# Patient Record
Sex: Female | Born: 1945 | Race: White | Hispanic: No | Smoking: Current every day smoker
Health system: Southern US, Community
[De-identification: ages and names within clinical notes are randomized; demographics above are authoritative.]

## PROBLEM LIST (undated history)

## (undated) HISTORY — PX: CERVICAL FUSION: SHX112

## (undated) HISTORY — PX: ORIF ANKLE FRACTURE: SUR919

## (undated) HISTORY — PX: APPENDECTOMY: SHX54

## (undated) HISTORY — PX: SHOULDER ACROMIOPLASTY: SHX6093

## (undated) HISTORY — PX: ABDOMINAL HYSTERECTOMY: SHX81

---

## 2002-12-12 ENCOUNTER — Ambulatory Visit (HOSPITAL_BASED_OUTPATIENT_CLINIC_OR_DEPARTMENT_OTHER): Admission: RE | Admit: 2002-12-12 | Discharge: 2002-12-13 | Payer: Self-pay | Admitting: Orthopedic Surgery

## 2004-08-27 ENCOUNTER — Ambulatory Visit: Payer: Self-pay

## 2004-08-28 ENCOUNTER — Ambulatory Visit: Payer: Self-pay | Admitting: Family Medicine

## 2005-09-23 ENCOUNTER — Ambulatory Visit: Payer: Self-pay

## 2007-01-25 ENCOUNTER — Ambulatory Visit: Payer: Self-pay

## 2007-04-18 ENCOUNTER — Emergency Department: Payer: Self-pay | Admitting: Emergency Medicine

## 2007-04-18 ENCOUNTER — Other Ambulatory Visit: Payer: Self-pay

## 2007-06-09 ENCOUNTER — Ambulatory Visit: Payer: Self-pay | Admitting: Unknown Physician Specialty

## 2008-01-26 ENCOUNTER — Ambulatory Visit: Payer: Self-pay | Admitting: Family Medicine

## 2008-04-11 ENCOUNTER — Encounter: Payer: Self-pay | Admitting: Family Medicine

## 2009-08-02 ENCOUNTER — Ambulatory Visit: Payer: Self-pay | Admitting: Family

## 2009-11-28 ENCOUNTER — Ambulatory Visit: Payer: Self-pay | Admitting: Gastroenterology

## 2010-01-28 ENCOUNTER — Emergency Department: Payer: Self-pay | Admitting: Emergency Medicine

## 2010-08-04 ENCOUNTER — Ambulatory Visit: Payer: Self-pay | Admitting: Family

## 2011-08-27 ENCOUNTER — Ambulatory Visit: Payer: Self-pay | Admitting: Orthopedic Surgery

## 2011-09-22 ENCOUNTER — Ambulatory Visit: Payer: Self-pay | Admitting: Family Medicine

## 2011-09-24 ENCOUNTER — Ambulatory Visit: Payer: Self-pay | Admitting: Orthopedic Surgery

## 2011-09-24 DIAGNOSIS — Z01810 Encounter for preprocedural cardiovascular examination: Secondary | ICD-10-CM

## 2011-10-01 ENCOUNTER — Ambulatory Visit: Payer: Self-pay | Admitting: Orthopedic Surgery

## 2012-08-26 ENCOUNTER — Ambulatory Visit: Payer: Self-pay | Admitting: Ophthalmology

## 2012-09-07 ENCOUNTER — Ambulatory Visit: Payer: Self-pay | Admitting: Ophthalmology

## 2012-10-25 ENCOUNTER — Ambulatory Visit: Payer: Self-pay | Admitting: Ophthalmology

## 2013-01-16 ENCOUNTER — Ambulatory Visit: Payer: Self-pay | Admitting: Family Medicine

## 2013-01-19 ENCOUNTER — Ambulatory Visit: Payer: Self-pay | Admitting: Family Medicine

## 2014-02-01 ENCOUNTER — Ambulatory Visit: Payer: Self-pay | Admitting: Family Medicine

## 2014-09-21 NOTE — Op Note (Signed)
PATIENT NAME:  Patricia GrinderSHELLHOUSE, [name] MR#:  [id number] DATE OF BIRTH:  1946-02-01  DATE OF PROCEDURE:  10/25/2012  PREOPERATIVE DIAGNOSIS: Visually significant cataract of the left eye.   POSTOPERATIVE DIAGNOSIS: Visually significant cataract of the left eye.   OPERATIVE PROCEDURE: Cataract extraction by phacoemulsification with implant of intraocular lens to left eye.   SURGEON: Galen ManilaWilliam Raeford Brandenburg, MD.   ANESTHESIA:  1. Managed anesthesia care.  2. Topical tetracaine drops followed by 2% Xylocaine jelly applied in the preoperative holding area.   COMPLICATIONS: None.   TECHNIQUE:  Stop and chop.   DESCRIPTION OF PROCEDURE: The patient was examined and consented in the preoperative holding area where the aforementioned topical anesthesia was applied to the left eye and then brought back to the Operating Room where the left eye was prepped and draped in the usual sterile ophthalmic fashion and a lid speculum was placed. A paracentesis was created with the side port blade and the anterior chamber was filled with viscoelastic. A near clear corneal incision was performed with the steel keratome. A continuous curvilinear capsulorrhexis was performed with a cystotome followed by the capsulorrhexis forceps. Hydrodissection and hydrodelineation were carried out with BSS on a blunt cannula. The lens was removed in a stop and chop technique and the remaining cortical material was removed with the irrigation-aspiration handpiece. The capsular bag was inflated with viscoelastic and the Tecnis ZCB00 21.5-diopter lens, serial number [id number] was placed in the capsular bag without complication. The remaining viscoelastic was removed from the eye with the irrigation-aspiration handpiece. The wounds were hydrated. The anterior chamber was flushed with Miostat and the eye was inflated to physiologic pressure. 0.1 mL of cefuroxime concentration 10 mg/mL was placed in the anterior chamber. The wounds were found to be  water tight. The eye was dressed with Vigamox and Combigan. The patient was given protective glasses to wear throughout the day and a shield with which to sleep tonight. The patient was also given drops with which to begin a drop regimen today and will follow-up with me in one day.  ____________________________ Jerilee FieldWilliam L. Kalei Mckillop, MD wlp:sb D: 10/25/2012 12:34:54 ET T: 10/25/2012 13:01:06 ET JOB#: [id number]  cc: Makennah Omura L. Rehaan Viloria, MD, <Dictator> Jerilee FieldWILLIAM L Yanet Balliet MD ELECTRONICALLY SIGNED 10/27/2012 17:56

## 2014-09-21 NOTE — Op Note (Signed)
PATIENT NAME:  Darrick GrinderSHELLHOUSE, Patricia Hahn DATE OF BIRTH:  01-24-1946  DATE OF PROCEDURE:  09/07/2012  PREOPERATIVE DIAGNOSIS: Visually significant cataract of the right eye.   POSTOPERATIVE DIAGNOSIS: Visually significant cataract of the right eye.   OPERATIVE PROCEDURE: Cataract extraction by phacoemulsification with implant of intraocular lens to right eye.   SURGEON: Galen ManilaWilliam Brelynn Wheller, MD.   ANESTHESIA:  1. Managed anesthesia care.  2. Topical tetracaine drops followed by 2% Xylocaine jelly applied in the preoperative holding area.   COMPLICATIONS: None.   TECHNIQUE:  Stop and chop.  DESCRIPTION OF PROCEDURE: The patient was examined and consented in the preoperative holding area where the aforementioned topical anesthesia was applied to the right eye and then brought back to the Operating Room where the right eye was prepped and draped in the usual sterile ophthalmic fashion and a lid speculum was placed. A paracentesis was created with the side port blade and the anterior chamber was filled with viscoelastic. A near clear corneal incision was performed with the steel keratome. A continuous curvilinear capsulorrhexis was performed with a cystotome followed by the capsulorrhexis forceps. Hydrodissection and hydrodelineation were carried out with BSS on a blunt cannula. The lens was removed in a stop and chop technique and the remaining cortical material was removed with the irrigation-aspiration handpiece. The capsular bag was inflated with viscoelastic and the Tecnis ZCB 22.0-diopter lens, serial number [id number] was placed in the capsular bag without complication. The remaining viscoelastic was removed from the eye with the irrigation-aspiration handpiece. The wounds were hydrated. The anterior chamber was flushed with Miostat and the eye was inflated to physiologic pressure. 0.1 mL of cefuroxime concentration 10 mg/mL was placed in the anterior chamber. The wounds were found to be  water tight. The eye was dressed with Vigamox. The patient was given protective glasses to wear throughout the day and a shield with which to sleep tonight. The patient was also given drops with which to begin a drop regimen today and will follow-up with me in one day.    ____________________________ Patricia FieldWilliam L. Kenadie Royce, MD wlp:OSi D: 09/07/2012 11:12:16 ET T: 09/07/2012 11:34:12 ET JOB#: [id number]  cc: Paislie Tessler L. Pilar Westergaard, MD, <Dictator> Patricia FieldWILLIAM L Oryon Gary MD ELECTRONICALLY SIGNED 09/13/2012 12:51

## 2014-09-23 NOTE — Op Note (Signed)
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  PROCEDURES:  1. Distal clavicle excision.  2. Rotator cuff repair, right shoulder.   ANESTHESIA: General.   SURGEON: Leitha SchullerMichael J. Beauty Pless, MD    DESCRIPTION OF PROCEDURE: The patient was brought to the operating room and after adequate anesthesia was obtained, a bump was placed underneath the right shoulder blade with the patient in a semirecumbent position. After prepping and draping in the usual sterile fashion, an incision was made over the [facility] and exposure of the [facility] was carried out. Approximately a quarter-inch of the distal clavicle was removed. There was extensive arthritic changes. After removal of this, the wound was thoroughly irrigated. Bone wax was applied to the end of the bone and the joint closed with #1 Vicryl. A deltoid-on approach was then made after making a more lateral incision over the distal acromion and the subacromial space was entered. Decompression was carried out with the use of a motorized rasp and release of the CA ligament. There was about a 2 cm tear with a vertical split. This was mobilized and two suture anchors placed after first placing sutures with the suture device. Sutures were placed more anterior and posterior and crisscrossed with the anterior sutures going to the posterior anchor which was placed distal to the greater tuberosity. After tightening both of these sutures down, there appeared to be anatomic repair. The shoulder was placed through a range of motion and the repair appeared stable. The excess suture was removed, the wound was thoroughly irrigated, the deltoid repaired using #1 Vicryl, 2-0 Vicryl subcutaneously, and 4-0 nylon for the skin. 30 mL of 0.5% Sensorcaine with  epinephrine was infiltrated in the area of the incision for postop analgesia.   ____________________________ Leitha SchullerMichael J. Patsye Sullivant, MD mjm:drc D: 10/01/2011 20:57:48 ET T: 10/02/2011 08:44:29 ET JOB#: [id number] Nolon BussingMICHAEL J Seona Clemenson MD ELECTRONICALLY SIGNED 10/03/2011 19:04

## 2014-09-26 ENCOUNTER — Ambulatory Visit
Admit: 2014-09-26 | Disposition: A | Discharge status: Home / Self Care | Payer: Self-pay | Attending: Orthopedic Surgery | Admitting: Orthopedic Surgery

## 2014-10-19 ENCOUNTER — Ambulatory Visit: Payer: 59 | Admitting: Anesthesiology

## 2014-10-19 ENCOUNTER — Ambulatory Visit
Admission: RE | Admit: 2014-10-19 | Discharge: 2014-10-19 | Disposition: A | Discharge status: Home / Self Care | Payer: 59 | Source: Ambulatory Visit | Attending: Gastroenterology | Admitting: Gastroenterology

## 2014-10-19 ENCOUNTER — Encounter: Payer: Self-pay | Admitting: Gastroenterology

## 2014-10-19 ENCOUNTER — Encounter
Admission: RE | Disposition: A | Discharge status: Home / Self Care | Payer: Self-pay | Source: Ambulatory Visit | Attending: Gastroenterology

## 2014-10-19 DIAGNOSIS — K621 Rectal polyp: Secondary | ICD-10-CM | POA: Insufficient documentation

## 2014-10-19 DIAGNOSIS — Z79899 Other long term (current) drug therapy: Secondary | ICD-10-CM | POA: Insufficient documentation

## 2014-10-19 DIAGNOSIS — Z800 Family history of malignant neoplasm of digestive organs: Secondary | ICD-10-CM | POA: Insufficient documentation

## 2014-10-19 DIAGNOSIS — Z1211 Encounter for screening for malignant neoplasm of colon: Secondary | ICD-10-CM | POA: Diagnosis not present

## 2014-10-19 DIAGNOSIS — K5730 Diverticulosis of large intestine without perforation or abscess without bleeding: Secondary | ICD-10-CM | POA: Insufficient documentation

## 2014-10-19 HISTORY — PX: COLONOSCOPY: SHX5424

## 2014-10-19 SURGERY — COLONOSCOPY
Anesthesia: General

## 2014-10-19 MED ORDER — SODIUM CHLORIDE 0.9 % IV SOLN
INTRAVENOUS | Status: DC
  Administered 2014-10-19: 1000 mL via INTRAVENOUS

## 2014-10-19 MED ORDER — LIDOCAINE HCL (CARDIAC) 20 MG/ML IV SOLN
INTRAVENOUS | Status: DC | PRN
  Administered 2014-10-19: 50 mg via INTRAVENOUS

## 2014-10-19 MED ORDER — PROPOFOL INFUSION 10 MG/ML OPTIME
INTRAVENOUS | Status: DC | PRN
  Administered 2014-10-19: 120 ug/kg/min via INTRAVENOUS

## 2014-10-19 MED ORDER — PROPOFOL 10 MG/ML IV BOLUS
INTRAVENOUS | Status: DC | PRN
  Administered 2014-10-19: 30 mg via INTRAVENOUS
  Administered 2014-10-19: 70 mg via INTRAVENOUS

## 2014-10-19 NOTE — Anesthesia Postprocedure Evaluation (Signed)
  Anesthesia Post-op Note  Patient: Patricia PentonLee Ann Cassedy  Procedure(s) Performed: Procedure(s): COLONOSCOPY (N/A)  Anesthesia type:General  Patient location: PACU  Post pain: Pain level controlled  Post assessment: Post-op Vital signs reviewed, Patient's Cardiovascular Status Stable, Respiratory Function Stable, Patent Airway and No signs of Nausea or vomiting  Post vital signs: Reviewed and stable  Last Vitals:  Filed Vitals:   10/19/14 1042  BP: 105/56  Pulse: 73  Temp: 35.9 C  Resp: 14    Level of consciousness: awake, alert  and patient cooperative  Complications: No apparent anesthesia complications

## 2014-10-19 NOTE — Op Note (Signed)
[facility] Gastroenterology Patient Name: Patricia Hahn Procedure Date: 10/19/2014 10:06 AM MRN: [id number] Account #: [id number] Date of Birth: 1946/02/17 Admit Type: Outpatient Age: [age] [facility] Gender: Female Note Status: Finalized Procedure:         Colonoscopy Indications:       Screening in patient at increased risk: Family history of                     1st-degree relative with colorectal cancer Providers:         Ezzard StandingPaul Y. Bluford Kaufmannh, MD Referring MD:      [facility]Dimple Nanascharles [facility] (Referring MD) Medicines:         Monitored Anesthesia Care Complications:     No immediate complications. Procedure:         Pre-Anesthesia Assessment:                    - Prior to the procedure, a History and Physical was                     performed, and patient medications, allergies and                     sensitivities were reviewed. The patient's tolerance of                     previous anesthesia was reviewed.                    - The risks and benefits of the procedure and the sedation                     options and risks were discussed with the patient. All                     questions were answered and informed consent was obtained.                    - After reviewing the risks and benefits, the patient was                     deemed in satisfactory condition to undergo the procedure.                    After obtaining informed consent, the colonoscope was                     passed under direct vision. Throughout the procedure, the                     patient's blood pressure, pulse, and oxygen saturations                     were monitored continuously. The Colonoscope was                     introduced through the anus and advanced to the the cecum,                     identified by appendiceal orifice and ileocecal valve. The                     colonoscopy was performed without difficulty. The patient  tolerated the  procedure well. The quality of the bowel                     preparation was good. Findings:      Multiple small and large-mouthed diverticula were found in the sigmoid       colon.      Five sessile polyps were found in the rectum. The polyps were diminutive       in size. These polyps were removed with a cold snare. Resection and       retrieval were complete.      The exam was otherwise without abnormality. Impression:        - Diverticulosis in the sigmoid colon.                    - Five diminutive polyps in the rectum. Resected and                     retrieved.                    - The examination was otherwise normal. Recommendation:    - Discharge patient to home.                    - Await pathology results.                    - Repeat colonoscopy in 5 years for surveillance.                    - The findings and recommendations were discussed with the                     patient. Procedure Code(s): --- Professional ---                    [phone], Colonoscopy, flexible; with removal of tumor(s),                     polyp(s), or other lesion(s) by snare technique Diagnosis Code(s): --- Professional ---                    Z80.0, Family history of malignant neoplasm of digestive                     organs                    K62.1, Rectal polyp                    K57.30, Diverticulosis of large intestine without                     perforation or abscess without bleeding CPT copyright 2014 American Medical Association. All rights reserved. The codes documented in this report are preliminary and upon coder review may  be revised to meet current compliance requirements. Wallace CullensPaul Y Hazelene Doten, MD 10/19/2014 10:32:47 AM This report has been signed electronically. Number of Addenda: 0 Note Initiated On: 10/19/2014 10:06 AM Scope Withdrawal Time: 0 hours 6 minutes 44 seconds  Total Procedure Duration: 0 hours 10 minutes 2 seconds       [facility]

## 2014-10-19 NOTE — Transfer of Care (Signed)
Immediate Anesthesia Transfer of Care Note  Patient: Patricia Hahn  Procedure(s) Performed: Procedure(s): COLONOSCOPY (N/A)  Patient Location: PACU and Endoscopy Unit  Anesthesia Type:General  Level of Consciousness: awake and alert   Airway & Oxygen Therapy: Patient Spontanous Breathing and Patient connected to nasal cannula oxygen  Post-op Assessment: Report given to RN and Post -op Vital signs reviewed and stable  Post vital signs: Reviewed  Last Vitals:  Filed Vitals:   10/19/14 1042  BP: 105/56  Pulse: 73  Temp: 35.9 C  Resp: 14    Complications: No apparent anesthesia complications

## 2014-10-19 NOTE — H&P (Signed)
    Primary Care Physician:  Pcp Not In System Primary Gastroenterologist:  Dr. Bluford Kaufmannh  Pre-Procedure History & Physical: HPI:  Patricia MoodLee Ann Bohanon is a 69 y.o. female is here for colonoscopy.   History reviewed. No pertinent past medical history.  History reviewed. No pertinent past surgical history.  Prior to Admission medications   Medication Sig Start Date End Date Taking? Authorizing Provider  ibuprofen (ADVIL,MOTRIN) 200 MG tablet Take 400 mg by mouth every 6 (six) hours as needed for headache or moderate pain.   Yes Historical Provider, MD  pravastatin (PRAVACHOL) 20 MG tablet Take 20 mg by mouth daily.   Yes Historical Provider, MD  VITAMIN D, ERGOCALCIFEROL, PO Take 1 capsule by mouth 2 (two) times daily.   Yes Historical Provider, MD    Allergies as of 10/03/2014  . (Not on File)    History reviewed. No pertinent family history.  History   Social History  . Marital Status: Divorced    Spouse Name: N/A  . Number of Children: N/A  . Years of Education: N/A   Occupational History  . Not on file.   Social History Main Topics  . Smoking status: Not on file  . Smokeless tobacco: Not on file  . Alcohol Use: Not on file  . Drug Use: Not on file  . Sexual Activity: Not on file   Other Topics Concern  . Not on file   Social History Narrative  . No narrative on file    Review of Systems: See HPI, otherwise negative ROS  Physical Exam: There were no vitals taken for this visit. General:   Alert,  pleasant and cooperative in NAD Head:  Normocephalic and atraumatic. Neck:  Supple; no masses or thyromegaly. Lungs:  Clear throughout to auscultation.    Heart:  Regular rate and rhythm. Abdomen:  Soft, nontender and nondistended. Normal bowel sounds, without guarding, and without rebound.   Neurologic:  Alert and  oriented x4;  grossly normal neurologically.  Impression/Plan: Patricia Hahn is here for colonoscopy to be performed for screening and family hx of  colon cancer.   Risks, benefits, limitations, and alternatives regarding colonoscopy have been reviewed with the patient.  Questions have been answered.  All parties agreeable.   Kamari Bilek, Ezzard StandingPAUL Y, MD  10/19/2014, 9:29 AM

## 2014-10-19 NOTE — Anesthesia Preprocedure Evaluation (Addendum)
Anesthesia Evaluation  Patient identified by MRN, date of birth, ID band Patient awake    Reviewed: Allergy & Precautions, NPO status , Patient's Chart, lab work & pertinent test results  Airway Mallampati: II  TM Distance: >3 FB Neck ROM: Full    Dental  (+) Upper Dentures   Pulmonary Current Smoker (1/2 ppd),          Cardiovascular     Neuro/Psych    GI/Hepatic   Endo/Other    Renal/GU      Musculoskeletal   Abdominal   Peds  Hematology   Anesthesia Other Findings   Reproductive/Obstetrics                            Anesthesia Physical Anesthesia Plan  ASA: II  Anesthesia Plan: General   Post-op Pain Management:    Induction: Intravenous  Airway Management Planned: Nasal Cannula  Additional Equipment:   Intra-op Plan:   Post-operative Plan:   Informed Consent: I have reviewed the patients History and Physical, chart, labs and discussed the procedure including the risks, benefits and alternatives for the proposed anesthesia with the patient or authorized representative who has indicated his/her understanding and acceptance.     Plan Discussed with:   Anesthesia Plan Comments:         Anesthesia Quick Evaluation

## 2014-10-23 ENCOUNTER — Encounter: Payer: Self-pay | Admitting: Gastroenterology

## 2014-10-24 LAB — SURGICAL PATHOLOGY

## 2015-07-23 ENCOUNTER — Other Ambulatory Visit: Payer: Self-pay | Admitting: Internal Medicine

## 2015-07-23 DIAGNOSIS — Z1231 Encounter for screening mammogram for malignant neoplasm of breast: Secondary | ICD-10-CM

## 2015-08-01 ENCOUNTER — Ambulatory Visit
Admission: RE | Admit: 2015-08-01 | Discharge: 2015-08-01 | Disposition: A | Discharge status: Home / Self Care | Payer: 59 | Source: Ambulatory Visit | Attending: Internal Medicine | Admitting: Internal Medicine

## 2015-08-01 DIAGNOSIS — Z1231 Encounter for screening mammogram for malignant neoplasm of breast: Secondary | ICD-10-CM | POA: Diagnosis present

## 2015-10-01 ENCOUNTER — Ambulatory Visit: Payer: 59 | Attending: Neurology | Admitting: Physical Therapy

## 2015-10-01 ENCOUNTER — Encounter: Payer: Self-pay | Admitting: Physical Therapy

## 2015-10-01 DIAGNOSIS — R2681 Unsteadiness on feet: Secondary | ICD-10-CM | POA: Diagnosis present

## 2015-10-01 DIAGNOSIS — R262 Difficulty in walking, not elsewhere classified: Secondary | ICD-10-CM | POA: Insufficient documentation

## 2015-10-01 NOTE — Therapy (Signed)
[facility] [address] [place], [place], [address] Phone: [phone]   Fax:  [phone]  Physical Therapy Treatment  Patient Details  Name: Patricia Hahn MRN: [id number] Date of Birth: 04/18/1946 Referring Provider: Morene CrockerPOTTER, ZACHARY E  Encounter Date: 10/01/2015      PT End of Session - 10/01/15 1619    Visit Number 1   Number of Visits 13   Date for PT Re-Evaluation 11/12/15   PT Start Time 0300   PT Stop Time 0350   PT Time Calculation (min) 50 min   Equipment Utilized During Treatment Gait belt   Activity Tolerance Patient limited by fatigue      No past medical history on file.  Past Surgical History  Procedure Laterality Date  . Shoulder acromioplasty    . Orif ankle fracture    . Cervical fusion    . Appendectomy    . Abdominal hysterectomy    . Colonoscopy N/A 10/19/2014    Procedure: COLONOSCOPY;  Surgeon: Wallace CullensPaul Y Oh, MD;  [facility];  Service: Gastroenterology;  Laterality: N/A;    There were no vitals filed for this visit.      Subjective Assessment - 10/01/15 1504    Subjective Patient is stumbling and unsteady gait.    Pertinent History Patient had a right ankle fracture 2 yrs ago, cancer, falls, lives alone   Currently in Pain? Yes   Pain Score 7    Pain Location Ankle            [facility] PT Assessment - 10/01/15 0001    Assessment   Medical Diagnosis gait imbalance   Referring Provider Theora MasterOTTER, ZACHARY E   Onset Date/Surgical Date 10/01/15   Hand Dominance Left   Next MD Visit 10/08/15   Prior Therapy no   Precautions   Precautions Fall   Restrictions   Weight Bearing Restrictions No   Balance Screen   Has the patient fallen in the past 6 months Yes   How many times? 6   Has the patient had a decrease in activity level because of a fear of falling?  Yes   Is the patient reluctant to leave their home because of a fear of falling?  No   Home Environment   Living  Environment Private residence   Living Arrangements Alone   Available Help at Discharge Family   Type of Home Mobile home   Home Access Stairs to enter   Entrance Stairs-Number of Steps 6   Entrance Stairs-Rails Cannot reach both   Home Layout One level   Home Equipment None      PAIN: RLE ankle pain 7/10   POSTURE: WFL  PROM/AROM: WFL  STRENGTH:  Graded on a 0-5 scale Muscle Group Left Right  Shoulder flex    Shoulder Abd    Shoulder Ext    Shoulder IR/ER    Elbow    Wrist/hand    Hip Flex 3+ 3+  Hip Abd 3+ 3+  Hip Add -3 -3  Hip Ext -3 -3  Hip IR/ER 3+ 3+  Knee Flex 4 4  Knee Ext 4 4  Ankle DF 5 5  Ankle PF 4 4   SENSATION: no numbness or tingling in BLE, numbness in thumb and first digit     BALANCE: static and dynamic standing balance deficits , unable to perform single leg stand or tandem stand   GAIT:  sway and deviation to path, uneven step  length  OUTCOME MEASURES: TEST Outcome Interpretation  5 times sit<>stand 23.62sec >91 yo, >15 sec indicates increased risk for falls  10 meter walk test   . 76              m/s <1.0 m/s indicates increased risk for falls; limited community ambulator  Timed up and Go 18. 61                sec <14 sec indicates increased risk for falls  6 minute walk test    900g            Feet 1000 feet is Tourist information centre manager    )                                     PT Long Term Goals - 22-Oct-2015 1635    PT LONG TERM GOAL #1   Title Patient will reduce timed up and go to <11 seconds to reduce fall risk and demonstrate improved transfer/gait ability   Time 6   Status New   PT LONG TERM GOAL #2   Title Patient will increase 10 meter walk test to >1.61m/s as to improve gait speed for better community ambulation and to reduce fall risk.   Time 6   Status New   PT LONG TERM GOAL #3   Title Patient will increase six minute walk test distance to >1000 for progression to community ambulator and improve gait  ability   Time 6   Period Weeks   Status New   PT LONG TERM GOAL #4   Title Patient will tolerate 5 seconds of single leg stance without loss of balance to improve ability to get in and out of shower safely   Time 6   Period Weeks   Status New               Plan - Oct 22, 2015 1621    Clinical Impression Statement Patient has decreased strength BLE and impaired gait with devicits and a falls risk. She has right ankle pain and decreased ambulation distances.    Rehab Potential Good   Clinical Impairments Affecting Rehab Potential weakness, falls, lives alone   PT Frequency 2x / week      Patient will benefit from skilled therapeutic intervention in order to improve the following deficits and impairments:  Abnormal gait, Pain, Decreased mobility, Decreased coordination, Decreased activity tolerance, Decreased balance, Decreased strength, Difficulty walking  Visit Diagnosis: Difficulty in walking, not elsewhere classified  Unsteadiness on feet       G-Codes - 10/22/2015 1626    Functional Assessment Tool Used TUG, 10 MW, 6 MW, 5 x sit to stand   Functional Limitation Mobility: Walking and moving around   Mobility: Walking and Moving Around Current Status [phone]) At least 40 percent but less than 60 percent impaired, limited or restricted   Mobility: Walking and Moving Around Goal Status [phone]) At least 20 percent but less than 40 percent impaired, limited or restricted      Problem List There are no active problems to display for this patient.  Patricia Hahn, PT, DPT [place], Barkley Bruns S 2015-10-22, 4:43 PM  [facility] MAIN [facility] SERVICES [address] [place], [place], [address] Phone: [phone]   Fax:  [phone]  Name: Patricia Hahn MRN: [id number] Date of Birth: December 22, 1945

## 2015-10-01 NOTE — Addendum Note (Signed)
Addended by: Ezekiel InaMANSFIELD, Brittin Janik S on: 10/01/2015 04:46 PM   Modules accepted: Orders

## 2015-10-03 ENCOUNTER — Ambulatory Visit: Payer: 59 | Admitting: Physical Therapy

## 2015-10-03 ENCOUNTER — Encounter: Payer: Self-pay | Admitting: Physical Therapy

## 2015-10-03 DIAGNOSIS — R262 Difficulty in walking, not elsewhere classified: Secondary | ICD-10-CM | POA: Diagnosis not present

## 2015-10-03 DIAGNOSIS — R2681 Unsteadiness on feet: Secondary | ICD-10-CM

## 2015-10-03 NOTE — Therapy (Signed)
[place] [facility] MAIN [facility] SERVICES [address] [place], [place], [address] Phone: [phone]   Fax:  [phone]  Physical Therapy Treatment  Patient Details  Name: Patricia Hahn MRN: [id number] Date of Birth: 07/07/1945 Referring Provider: Morene Crocker  Encounter Date: 10/03/2015      PT End of Session - 10/03/15 1525    Visit Number 2   Number of Visits 13   Date for PT Re-Evaluation 11/12/15   PT Start Time 0315   PT Stop Time 0400   PT Time Calculation (min) 45 min   Equipment Utilized During Treatment Gait belt   Activity Tolerance Patient limited by fatigue      History reviewed. No pertinent past medical history.  Past Surgical History  Procedure Laterality Date  . Shoulder acromioplasty    . Orif ankle fracture    . Cervical fusion    . Appendectomy    . Abdominal hysterectomy    . Colonoscopy N/A 10/19/2014    Procedure: COLONOSCOPY;  Surgeon: Wallace Cullens, MD;  Location: [facility] ENDOSCOPY;  Service: Gastroenterology;  Laterality: N/A;    There were no vitals filed for this visit.      Subjective Assessment - 10/03/15 1524    Subjective Patient is stumbling and unsteady gait.    Pertinent History Patient had a right ankle fracture 2 yrs ago, cancer, falls, lives alone   Currently in Pain? Yes   Pain Score 6    Pain Location Ankle   Pain Orientation Left        Leg press 60 lbs x 20 x 2, heel raises 60 lbs x 20 x 2 Supine SAQ 3 lbs x 5 sec hold x 20 x 2 hooklying abd/ER with RTB x 20 x 2 SLR BLE x 20  Bridges x 20 x 2 Hip abd x 20 beginning with 3 lbs and decreasing to 0 lbs   mod verbal cues used throughout with increased in postural sway and LOB most seen with narrow base of support .  Patient performs intermediate level exercises without pain behaviors and needs verbal cuing for postural alignment and head positioning                              PT Long Term Goals - 10/01/15  1635    PT LONG TERM GOAL #1   Title Patient will reduce timed up and go to <11 seconds to reduce fall risk and demonstrate improved transfer/gait ability   Time 6   Status New   PT LONG TERM GOAL #2   Title Patient will increase 10 meter walk test to >1.57m/s as to improve gait speed for better community ambulation and to reduce fall risk.   Time 6   Status New   PT LONG TERM GOAL #3   Title Patient will increase six minute walk test distance to >1000 for progression to community ambulator and improve gait ability   Time 6   Period Weeks   Status New   PT LONG TERM GOAL #4   Title Patient will tolerate 5 seconds of single leg stance without loss of balance to improve ability to get in and out of shower safely   Time 6   Period Weeks   Status New               Plan - 10/03/15 1526    Clinical Impression Statement Patient has  decreased strength BLE and abnormal gait wiht unsteadness. She is able to perform moderate difficulty exercises without increased pain behaviors.    Rehab Potential Good   Clinical Impairments Affecting Rehab Potential weakness, falls, lives alone   PT Frequency 2x / week      Patient will benefit from skilled therapeutic intervention in order to improve the following deficits and impairments:  Abnormal gait, Pain, Decreased mobility, Decreased coordination, Decreased activity tolerance, Decreased balance, Decreased strength, Difficulty walking  Visit Diagnosis: Difficulty in walking, not elsewhere classified  Unsteadiness on feet     Problem List There are no active problems to display for this patient.  Ezekiel InaKristine S Suleika Donavan, PT, DPT [place], Bark[place] BrunsKristine S 10/03/2015, 3:29 PM  [place] [facility] [address] [place], [place], [address] Phone: [phone]   Fax:  [phone]  Name: Malachy MoodLee Ann Sperbeck MRN: [id number] Date of Birth: [birth year]

## 2015-10-08 ENCOUNTER — Encounter: Payer: Self-pay | Admitting: Physical Therapy

## 2015-10-08 ENCOUNTER — Ambulatory Visit: Payer: 59 | Admitting: Physical Therapy

## 2015-10-08 DIAGNOSIS — R262 Difficulty in walking, not elsewhere classified: Secondary | ICD-10-CM | POA: Diagnosis not present

## 2015-10-08 DIAGNOSIS — R2681 Unsteadiness on feet: Secondary | ICD-10-CM

## 2015-10-08 NOTE — Therapy (Signed)
[place] [facility] MAIN [facility] SERVICES [address] [place], [place], [address] Phone: [phone]   Fax:  [phone]  Physical Therapy Treatment  Patient Details  Name: Patricia Hahn MRN: [id number] Date of Birth: November 12, 1945 Referring Provider: Morene Crocker  Encounter Date: 10/08/2015      PT End of Session - 10/08/15 1523    Visit Number 3   Number of Visits 13   Date for PT Re-Evaluation 11/12/15   PT Start Time 0315   PT Stop Time 0400   PT Time Calculation (min) 45 min   Equipment Utilized During Treatment Gait belt   Activity Tolerance Patient limited by fatigue      History reviewed. No pertinent past medical history.  Past Surgical History  Procedure Laterality Date  . Shoulder acromioplasty    . Orif ankle fracture    . Cervical fusion    . Appendectomy    . Abdominal hysterectomy    . Colonoscopy N/A 10/19/2014    Procedure: COLONOSCOPY;  Surgeon: Wallace Cullens, MD;  Location: [facility] ENDOSCOPY;  Service: Gastroenterology;  Laterality: N/A;    There were no vitals filed for this visit.      Subjective Assessment - 10/08/15 1514    Subjective Patient is stumbling and unsteady gait.    Pertinent History Patient had a right ankle fracture 2 yrs ago, cancer, falls, lives alone   Currently in Pain? No/denies    standing hip abd with YTB x 20  side stepping left and right in parallel bars 10 feet x 2 step ups from floor to 6 inch stool x 20 bilateral sit to stand x 10 marching in parallel bars x 20 Leg press with 75 lbs x 20 x 3 Leg press heel raises with 75 lbs x 20 x 3 TM x 5 minutes Min cueing needed to appropriately perform  tasks with leg, hand, and head position. Decreased coordination demonstrated requiring consistent verbal cueing to correct form.. Patient continues to demonstrate some in coordination of movement with select exercises. Patient responds well to verbal and tactile cues to correct form and technique.   CGA to SBA for safety with activities.                             PT Education - 10/08/15 1515    Education provided Yes   Education Details HEP   Person(s) Educated Patient   Methods Explanation   Comprehension Verbalized understanding             PT Long Term Goals - 10/01/15 1635    PT LONG TERM GOAL #1   Title Patient will reduce timed up and go to <11 seconds to reduce fall risk and demonstrate improved transfer/gait ability   Time 6   Status New   PT LONG TERM GOAL #2   Title Patient will increase 10 meter walk test to >1.17m/s as to improve gait speed for better community ambulation and to reduce fall risk.   Time 6   Status New   PT LONG TERM GOAL #3   Title Patient will increase six minute walk test distance to >1000 for progression to community ambulator and improve gait ability   Time 6   Period Weeks   Status New   PT LONG TERM GOAL #4   Title Patient will tolerate 5 seconds of single leg stance without loss of balance to improve ability to get  in and out of shower safely   Time 6   Period Weeks   Status New               Plan - 10/08/15 1524    Clinical Impression Statement Patient has decreased strength and abnormal gait with unsteadiness.    Rehab Potential Good   Clinical Impairments Affecting Rehab Potential weakness, falls, lives alone   PT Frequency 2x / week      Patient will benefit from skilled therapeutic intervention in order to improve the following deficits and impairments:  Abnormal gait, Pain, Decreased mobility, Decreased coordination, Decreased activity tolerance, Decreased balance, Decreased strength, Difficulty walking  Visit Diagnosis: Difficulty in walking, not elsewhere classified  Unsteadiness on feet     Problem List There are no active problems to display for this patient.  Ezekiel InaKristine S Barnard Sharps, PT, DPT  [place], [place] 10/08/2015, 3:26 PM  [place] [facility] [address] , [place], [address] Phone: [phone]   Fax:  [phone]  Name: Patricia Hahn MRN: [id number] Date of Birth: Jul 26, 1945

## 2015-10-10 ENCOUNTER — Ambulatory Visit: Payer: 59 | Admitting: Physical Therapy

## 2015-10-10 ENCOUNTER — Encounter: Payer: Self-pay | Admitting: Physical Therapy

## 2015-10-10 DIAGNOSIS — R262 Difficulty in walking, not elsewhere classified: Secondary | ICD-10-CM | POA: Diagnosis not present

## 2015-10-10 DIAGNOSIS — R2681 Unsteadiness on feet: Secondary | ICD-10-CM

## 2015-10-10 NOTE — Therapy (Signed)
[place] [facility] MAIN [facility] SERVICES [address] [place], [place], [address] Phone: [phone]   Fax:  [phone]  Physical Therapy Treatment  Patient Details  Name: Dilyn Osoria MRN: [id number] Date of Birth: Jul 06, 1945 Referring Provider: Morene Crocker  Encounter Date: 10/10/2015      PT End of Session - 10/10/15 1519    Visit Number 4   Number of Visits 13   Date for PT Re-Evaluation 11/12/15   PT Start Time 0315   PT Stop Time 0400   PT Time Calculation (min) 45 min   Equipment Utilized During Treatment Gait belt   Activity Tolerance Patient limited by fatigue      History reviewed. No pertinent past medical history.  Past Surgical History  Procedure Laterality Date  . Shoulder acromioplasty    . Orif ankle fracture    . Cervical fusion    . Appendectomy    . Abdominal hysterectomy    . Colonoscopy N/A 10/19/2014    Procedure: COLONOSCOPY;  Surgeon: Wallace Cullens, MD;  Location: [facility] ENDOSCOPY;  Service: Gastroenterology;  Laterality: N/A;    There were no vitals filed for this visit.      Subjective Assessment - 10/10/15 1517    Subjective Patient is stumbling and unsteady gait.. She has weakness in BLE.     Pertinent History Patient had a right ankle fracture 2 yrs ago, cancer, falls, lives alone   Currently in Pain? No/denies      THER-EX Nustep L3 x 5 minutes for warm-up during history (5 minutes unbilled); Quantum double leg press 75 #  x 10 x 3 Heel raises with UE support and toes on 2x4, 2 x 10; Mini squats with RTB around knees to prevent valgus 2 x 10; RTB side stepping in // bars 4 lengths x 2; Sit to stand without UE support 2 x 10 standing hip abd with YTB x 20 step ups from floor to 6 inch stool x 20 bilateral marching in parallel bars x 20 Patient needs occasional verbal cueing to improve posture and cueing to correctly perform exercises slowly, holding at end of range to increase motor firing of  desired muscle to encourage fatigue.                             PT Education - 10/10/15 1518    Education provided Yes   Education Details HEP   Person(s) Educated Patient   Methods Explanation   Comprehension Verbalized understanding             PT Long Term Goals - 10/01/15 1635    PT LONG TERM GOAL #1   Title Patient will reduce timed up and go to <11 seconds to reduce fall risk and demonstrate improved transfer/gait ability   Time 6   Status New   PT LONG TERM GOAL #2   Title Patient will increase 10 meter walk test to >1.34m/s as to improve gait speed for better community ambulation and to reduce fall risk.   Time 6   Status New   PT LONG TERM GOAL #3   Title Patient will increase six minute walk test distance to >1000 for progression to community ambulator and improve gait ability   Time 6   Period Weeks   Status New   PT LONG TERM GOAL #4   Title Patient will tolerate 5 seconds of single leg stance without loss of  balance to improve ability to get in and out of shower safely   Time 6   Period Weeks   Status New               Plan - 10/10/15 1519    Clinical Impression Statement Tactile cues and assistance needed to keep lower leg and knee in neutral to avoid compensations with ankle motions   Rehab Potential Good   Clinical Impairments Affecting Rehab Potential weakness, falls, lives alone   PT Frequency 2x / week      Patient will benefit from skilled therapeutic intervention in order to improve the following deficits and impairments:  Abnormal gait, Pain, Decreased mobility, Decreased coordination, Decreased activity tolerance, Decreased balance, Decreased strength, Difficulty walking  Visit Diagnosis: Difficulty in walking, not elsewhere classified  Unsteadiness on feet     Problem List There are no active problems to display for this patient. Ezekiel InaKristine S Vu Liebman, PT, DPT  [place], [place] 10/10/2015, 3:21  PM  [place] [facility] [address] [place], [place], [address] Phone: [phone]   Fax:  [phone]  Name: Malachy MoodLee Ann Pain MRN: [id number] Date of Birth: July 01, 1945

## 2015-10-11 ENCOUNTER — Ambulatory Visit: Payer: 59 | Admitting: Physical Therapy

## 2015-10-14 ENCOUNTER — Ambulatory Visit: Payer: 59

## 2015-10-14 DIAGNOSIS — R2681 Unsteadiness on feet: Secondary | ICD-10-CM

## 2015-10-14 DIAGNOSIS — R262 Difficulty in walking, not elsewhere classified: Secondary | ICD-10-CM

## 2015-10-15 ENCOUNTER — Ambulatory Visit: Payer: 59

## 2015-10-15 NOTE — Therapy (Signed)
[facility] [address] [place], [place], [address] Phone: [phone]   Fax:  [phone]  Physical Therapy Treatment  Patient Details  Name: Patricia Hahn MRN: [id number] Date of Birth: 10/19/1945 Referring Provider: Morene CrockerPOTTER, ZACHARY E  Encounter Date: 10/14/2015      PT End of Session - 10/15/15 0841    Visit Number 5   Number of Visits 13   Date for PT Re-Evaluation 11/12/15   PT Start Time 0800   PT Stop Time 0841   PT Time Calculation (min) 41 min   Equipment Utilized During Treatment Gait belt   Activity Tolerance Patient limited by fatigue   Behavior [facility] Therapy [facility] for tasks assessed/performed      History reviewed. No pertinent past medical history.  Past Surgical History  Procedure Laterality Date  . Shoulder acromioplasty    . Orif ankle fracture    . Cervical fusion    . Appendectomy    . Abdominal hysterectomy    . Colonoscopy N/A 10/19/2014    Procedure: COLONOSCOPY;  Surgeon: Wallace CullensPaul Y Oh, MD;  [facility];  Service: Gastroenterology;  Laterality: N/A;    There were no vitals filed for this visit.      Subjective Assessment - 10/15/15 0840    Subjective pt reports she feels like she is getting stronger    Pertinent History Patient had a right ankle fracture 2 yrs ago, cancer, falls, lives alone   Currently in Pain? No/denies      therex:  Leg press 90lbs 2x15 Heel raise on leg press 90lbs 2x10 Standing resisted hip flexion, abduction ,adduction and extension with  Red       Band x 10 each way  CC walk out side step 12lbs x 3 laps each way  fwd lunge 2x10 each leg Side lunge 2x10 each leg  Hip hike 2x10 RLE Toe taps on AIREX onto BOSU  2 min x 3 (no UE) Side toe taps on AIREX onto BOSU  X 1 min each side  pt requires CGA for safety on balance exercises  Pt needs cues to promote more weight shift for SL or stepping  activities                              PT Long Term Goals - 10/01/15 1635    PT LONG TERM GOAL #1   Title Patient will reduce timed up and go to <11 seconds to reduce fall risk and demonstrate improved transfer/gait ability   Time 6   Status New   PT LONG TERM GOAL #2   Title Patient will increase 10 meter walk test to >1.10237m/s as to improve gait speed for better community ambulation and to reduce fall risk.   Time 6   Status New   PT LONG TERM GOAL #3   Title Patient will increase six minute walk test distance to >1000 for progression to community ambulator and improve gait ability   Time 6   Period Weeks   Status New   PT LONG TERM GOAL #4   Title Patient will tolerate 5 seconds of single leg stance without loss of balance to improve ability to get in and out of shower safely   Time 6   Period Weeks   Status New               Plan - 10/15/15 [id number]  Clinical Impression Statement pt did well with progression of strengthening and balance exercises. she does loose her balance at times needing CGA for some activities. pt has R trendelenberg which gives her some difficulty clearing LLE during some single leg activities. pt also struggles with weight shifting. she woudl benefit from continued PT to help address this    Rehab Potential Good   Clinical Impairments Affecting Rehab Potential weakness, falls, lives alone   PT Frequency 2x / week   PT Duration 4 weeks      Patient will benefit from skilled therapeutic intervention in order to improve the following deficits and impairments:  Abnormal gait, Pain, Decreased mobility, Decreased coordination, Decreased activity tolerance, Decreased balance, Decreased strength, Difficulty walking  Visit Diagnosis: Difficulty in walking, not elsewhere classified  Unsteadiness on feet     Problem List There are no active problems to display for this patient.  Carlyon Shadow. Dontavian Marchi, PT, DPT  [phone]  Brenna Friesenhahn 10/15/2015, 8:43 AM  [place] [facility] MAIN [facility] SERVICES [address] [place], [place], [address] Phone: [phone]   Fax:  [phone]  Name: Patricia Hahn MRN: [id number] Date of Birth: 1946/01/11

## 2015-10-16 ENCOUNTER — Ambulatory Visit: Payer: 59

## 2015-10-16 DIAGNOSIS — R2681 Unsteadiness on feet: Secondary | ICD-10-CM

## 2015-10-16 DIAGNOSIS — R262 Difficulty in walking, not elsewhere classified: Secondary | ICD-10-CM | POA: Diagnosis not present

## 2015-10-16 NOTE — Therapy (Signed)
[place] MAIN [facility] SERVICES [address] [place], [place], [address] Phone: [phone]   Fax:  [phone]  Physical Therapy Treatment  Patient Details  Name: Patricia Hahn MRN: [id number] Date of Birth: 04-15-1946 Referring Provider: Morene Crocker  Encounter Date: 10/16/2015      PT End of Session - 10/16/15 1836    Visit Number 6   Number of Visits 13   Date for PT Re-Evaluation 11/12/15   PT Start Time 1745   PT Stop Time 1825   PT Time Calculation (min) 40 min   Equipment Utilized During Treatment Gait belt   Activity Tolerance Patient limited by fatigue   Behavior During Therapy [name] for tasks assessed/performed      History reviewed. No pertinent past medical history.  Past Surgical History  Procedure Laterality Date  . Shoulder acromioplasty    . Orif ankle fracture    . Cervical fusion    . Appendectomy    . Abdominal hysterectomy    . Colonoscopy N/A 10/19/2014    Procedure: COLONOSCOPY;  Surgeon: Wallace Cullens, MD;  Location: [facility] ENDOSCOPY;  Service: Gastroenterology;  Laterality: N/A;    There were no vitals filed for this visit.      Subjective Assessment - 10/16/15 1835    Subjective pt reports she does feel like her balance is improving    Pertinent History Patient had a right ankle fracture 2 yrs ago, cancer, falls, lives alone   Patient Stated Goals get stronger, reduce falls   Currently in Pain? No/denies      Therex: Leg press 100lbs 3x10 HR on leg press 100lbs x 20 Fwd/retro walk out on matrix 7.5lbs x 5 laps each. Pt needs min A for assistance to keep balance, cues for corrective weight shift Fwd lunge on BOSU x 15 each leg 3 step SLS hold x 4 min Hip hike 3x10 RLE Standing NBOS on AIREX with SS and vertical head turns 3x10 each Pt requires min verbal and tactile cues for proper exercise performance   pt requires CGA-min A for safety on balance exercises                              PT Education - 10/16/15 1835    Education provided Yes   Education Details systems of balance with focus on vestibular input   Person(s) Educated Patient   Methods Explanation   Comprehension Verbalized understanding             PT Long Term Goals - 10/01/15 1635    PT LONG TERM GOAL #1   Title Patient will reduce timed up and go to <11 seconds to reduce fall risk and demonstrate improved transfer/gait ability   Time 6   Status New   PT LONG TERM GOAL #2   Title Patient will increase 10 meter walk test to >1.8m/s as to improve gait speed for better community ambulation and to reduce fall risk.   Time 6   Status New   PT LONG TERM GOAL #3   Title Patient will increase six minute walk test distance to >1000 for progression to community ambulator and improve gait ability   Time 6   Period Weeks   Status New   PT LONG TERM GOAL #4   Title Patient will tolerate 5 seconds of single leg stance without loss of balance to improve ability to get in and out  of shower safely   Time 6   Period Weeks   Status New               Plan - 10/16/15 1836    Clinical Impression Statement pt balance was quite disturbed with vestibular input changes such as head turns. 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 pt was instructed to do some vestibular training at home.    Rehab Potential Good   Clinical Impairments Affecting Rehab Potential weakness, falls, lives alone   PT Frequency 2x / week   PT Duration 4 weeks      Patient will benefit from skilled therapeutic intervention in order to improve the following deficits and impairments:  Abnormal gait, Pain, Decreased mobility, Decreased coordination, Decreased activity tolerance, Decreased balance, Decreased strength, Difficulty walking  Visit Diagnosis: Unsteadiness on feet     Problem List There are no  active problems to display for this patient.  Carlyon ShadowAshley C. Cletis Muma, PT, DPT [phone]  Cherie Lasalle 10/16/2015, 6:38 PM  [place] [facility] [address] [place], [place], [address] Phone: [phone]   Fax:  [phone]  Name: Patricia Hahn MRN: [id number] Date of Birth: Apr 16, 1946

## 2015-10-22 ENCOUNTER — Ambulatory Visit: Payer: 59

## 2015-10-23 ENCOUNTER — Ambulatory Visit: Payer: 59 | Admitting: Physical Therapy

## 2015-10-24 ENCOUNTER — Ambulatory Visit: Payer: 59

## 2015-10-24 DIAGNOSIS — R262 Difficulty in walking, not elsewhere classified: Secondary | ICD-10-CM | POA: Diagnosis not present

## 2015-10-24 DIAGNOSIS — R2681 Unsteadiness on feet: Secondary | ICD-10-CM

## 2015-10-24 NOTE — Therapy (Signed)
[facility] MAIN [facility] SERVICES [address] [place], [place], [address] Phone: [phone]   Fax:  [phone]  Physical Therapy Treatment  Patient Details  Name: Patricia Hahn MRN: [id number] Date of Birth: 05-11-1946 Referring Provider: Morene Crocker  Encounter Date: 10/24/2015      PT End of Session - 10/24/15 1315    Visit Number 7   Number of Visits 13   Date for PT Re-Evaluation 11/12/15   PT Start Time 1300   PT Stop Time 1345   PT Time Calculation (min) 45 min   Equipment Utilized During Treatment Gait belt   Activity Tolerance Patient tolerated treatment well   Behavior During Therapy [facility] for tasks assessed/performed      No past medical history on file.  Past Surgical History  Procedure Laterality Date  . Shoulder acromioplasty    . Orif ankle fracture    . Cervical fusion    . Appendectomy    . Abdominal hysterectomy    . Colonoscopy N/A 10/19/2014    Procedure: COLONOSCOPY;  Surgeon: Wallace Cullens, MD;  Location: [facility] ENDOSCOPY;  Service: Gastroenterology;  Laterality: N/A;    There were no vitals filed for this visit.      Subjective Assessment - 10/24/15 1309    Subjective pt reports she notices she stands with her knees bent at the kitchen counter and she doesnt know why   Pertinent History Patient had a right ankle fracture 2 yrs ago, cancer, falls, lives alone   Patient Stated Goals get stronger, reduce falls   Currently in Pain? No/denies     Therex:   TM walking: x 3 min 1.6 mph cues for step length and heel strike  Side step up with eccentric step down 2 x 10 each side- cues to keep knee behind toe Fwd lunge on BOSU 2 x 10 each leg Pt requires min verbal and tactile cues for proper exercise performance    NMR standing on BOSU flat down 1 min x 3 no UE Toe taps on BOSU standing on AIREX 2 x 60fwd, then 1 x 20 each side n o UE 1/2 roll AP balance 1 min x 4 1/2 roll tandem balance 1 minx 2 each  leg with head turns Fwd walking over AIREX beam with ball toss into bucked x 8 min  pt requires CGA to min A for safety on balance exercises                                PT Long Term Goals - 10/01/15 1635    PT LONG TERM GOAL #1   Title Patient will reduce timed up and go to <11 seconds to reduce fall risk and demonstrate improved transfer/gait ability   Time 6   Status New   PT LONG TERM GOAL #2   Title Patient will increase 10 meter walk test to >1.8m/s as to improve gait speed for better community ambulation and to reduce fall risk.   Time 6   Status New   PT LONG TERM GOAL #3   Title Patient will increase six minute walk test distance to >1000 for progression to community ambulator and improve gait ability   Time 6   Period Weeks   Status New   PT LONG TERM GOAL #4   Title Patient will tolerate 5 seconds of single leg stance without loss of balance to improve  ability to get in and out of shower safely   Time 6   Period Weeks   Status New               Plan - 10/24/15 1315    Clinical Impression Statement pt did well with progression of strengthening and balance exercise. she has more difficulty on the RLE vs the LLE regarding strength and coordination   Rehab Potential Good   Clinical Impairments Affecting Rehab Potential weakness, falls, lives alone   PT Frequency 2x / week   PT Duration 4 weeks      Patient will benefit from skilled therapeutic intervention in order to improve the following deficits and impairments:  Abnormal gait, Pain, Decreased mobility, Decreased coordination, Decreased activity tolerance, Decreased balance, Decreased strength, Difficulty walking  Visit Diagnosis: Difficulty in walking, not elsewhere classified  Unsteadiness on feet     Problem List There are no active problems to display for this patient.  Carlyon ShadowAshley C. Robena Ewy, PT, DPT [phone]  Aine Strycharz 10/24/2015, 1:43 PM  [place] [facility] MAIN [facility]REHAB SERVICES [address] [place], [place], [address] Phone: [phone]   Fax:  [phone]  Name: Patricia Hahn MRN: [id number] Date of Birth: 11-01-1945

## 2015-10-30 ENCOUNTER — Ambulatory Visit: Payer: 59

## 2015-10-30 ENCOUNTER — Ambulatory Visit: Payer: 59 | Admitting: Physical Therapy

## 2015-10-30 DIAGNOSIS — R262 Difficulty in walking, not elsewhere classified: Secondary | ICD-10-CM

## 2015-10-30 DIAGNOSIS — R2681 Unsteadiness on feet: Secondary | ICD-10-CM

## 2015-10-31 NOTE — Therapy (Signed)
[place] MAIN [facility] SERVICES [address] [place], [place], [address] Phone: [phone]   Fax:  [phone]  Physical Therapy Treatment/ Progress note  Patient Details  Name: Patricia Hahn MRN: [id number] Date of Birth: 05-02-1946 Referring Provider: Anabel Bene  Encounter Date: 10/30/2015      PT End of Session - 10/31/15 0932    Visit Number 8   Number of Visits 17   Date for PT Re-Evaluation 11/28/15   PT Start Time 2919   PT Stop Time 1815   PT Time Calculation (min) 45 min   Equipment Utilized During Treatment Gait belt   Activity Tolerance Patient tolerated treatment well   Behavior During Therapy [facility] for tasks assessed/performed      History reviewed. No pertinent past medical history.  Past Surgical History  Procedure Laterality Date  . Shoulder acromioplasty    . Orif ankle fracture    . Cervical fusion    . Appendectomy    . Abdominal hysterectomy    . Colonoscopy N/A 10/19/2014    Procedure: COLONOSCOPY;  Surgeon: Hulen Luster, MD;  Location: [facility] ENDOSCOPY;  Service: Gastroenterology;  Laterality: N/A;    There were no vitals filed for this visit.      Subjective Assessment - 10/31/15 0931    Subjective pt reports she feels like she is improving her mobility and strength at home and at work    Pertinent History Patient had a right ankle fracture 2 yrs ago, cancer, falls, lives alone   Patient Stated Goals get stronger, reduce falls   Currently in Pain? No/denies       therex: PT assessed outcome measures and progress towards goals as follows"     [facility] PT Assessment - 10/31/15 0001    Standardized Balance Assessment   Standardized Balance Assessment Timed Up and Go Test;10 meter walk test;Five Times Sit to Stand   Five times sit to stand comments  12.40   Berg Balance Test   Sit to Stand Able to stand without using hands and stabilize independently   Standing Unsupported Able to stand safely 2  minutes   Sitting with Back Unsupported but Feet Supported on Floor or Stool Able to sit safely and securely 2 minutes   Stand to Sit Sits safely with minimal use of hands   Transfers Able to transfer safely, minor use of hands   Standing Unsupported with Eyes Closed Able to stand 10 seconds with supervision   Standing Ubsupported with Feet Together Able to place feet together independently and stand for 1 minute with supervision   From Standing, Reach Forward with Outstretched Arm Can reach forward >12 cm safely (5")   From Standing Position, Pick up Object from [place] to pick up shoe safely and easily   From Standing Position, Turn to Look Behind Over each Shoulder Looks behind from both sides and weight shifts well   Turn 360 Degrees Needs close supervision or verbal cueing   Standing Unsupported, Alternately Place Feet on Step/Stool Able to complete 4 steps without aid or supervision   Standing Unsupported, One Foot in Front Able to plae foot ahead of the other independently and hold 30 seconds   Standing on One Leg Tries to lift leg/unable to hold 3 seconds but remains standing independently   Total Score 44    21mn walk: 9333f Standing resisted hip flexion, abduction ,adduction and extension with       5lbs in cable  column x  10 each way BLE Pt requires min verbal and tactile cues for proper exercise performance                          PT Education - 10/31/15 0931    Education provided Yes   Education Details progress towards goals, PT POC   Person(s) Educated Patient   Methods Explanation   Comprehension Verbalized understanding             PT Long Term Goals - 10/31/15 0934    PT LONG TERM GOAL #1   Title Patient will reduce timed up and go to <11 seconds to reduce fall risk and demonstrate improved transfer/gait ability   Time 6   Status Achieved   PT LONG TERM GOAL #2   Title Patient will increase 10 meter walk test to >1.58ms as to improve  gait speed for better community ambulation and to reduce fall risk.   Time 6   Status Achieved   PT LONG TERM GOAL #3   Title Patient will increase six minute walk test distance to >1000 for progression to community ambulator and improve gait ability   Baseline 935   Time 6   Period Weeks   Status Partially Met   PT LONG TERM GOAL #4   Title Patient will tolerate 5 seconds of single leg stance without loss of balance to improve ability to get in and out of shower safely   Baseline 3s   Time 6   Period Weeks   Status Partially Met   PT LONG TERM GOAL #5   Title pt will improve berg balnace score by 6pts demonstrating improved balance    Time 4   Period Weeks   Status New   Additional Long Term Goals   Additional Long Term Goals Yes   PT LONG TERM GOAL #6   Title pt will safely ambulate 1582fover uneven surface to work in her yard   Time 4   Period Weeks   Status New   PT LONG TERM GOAL #7   Title pt will score >19/24 on DGI to reduce fall risk   Time 4   Period Weeks   Status New               Plan - 10/31/15 0932    Clinical Impression Statement pt has made great progress regarding her strength and mobility and has achieved initial PT goals. she still demonstrates significant balance deficits and residual strength deficits especially on the RLE which would benefit from continued therapy. pt demonstrates somewhat of an ataxic gait pattern with the R side. PT assessed reflexes, clonus, and pathologic reflexes which were all negative, however her gait pattern and at times her LOB does seem to show a possible neurologic component.    Rehab Potential Good   Clinical Impairments Affecting Rehab Potential weakness, falls, lives alone   PT Frequency 2x / week   PT Duration 4 weeks   PT Treatment/Interventions Patient/family education;Neuromuscular re-education;Balance training;Therapeutic exercise;Therapeutic activities;Functional mobility training;Stair training;Gait  training;Manual techniques   Consulted and Agree with Plan of Care Patient      Patient will benefit from skilled therapeutic intervention in order to improve the following deficits and impairments:  Abnormal gait, Pain, Decreased mobility, Decreased coordination, Decreased activity tolerance, Decreased balance, Decreased strength, Difficulty walking  Visit Diagnosis: Difficulty in walking, not elsewhere classified - Plan: PT plan of care cert/re-cert  Unsteadiness on  feet - Plan: PT plan of care cert/re-cert       G-Codes - 11/12/15 0936    Functional Assessment Tool Used berg/83malk/5xsittostand   Functional Limitation Mobility: Walking and moving around   Mobility: Walking and Moving Around Current Status [phone] At least 20 percent but less than 40 percent impaired, limited or restricted   Mobility: Walking and Moving Around Goal Status (([id number] At least 1 percent but less than 20 percent impaired, limited or restricted      Problem List There are no active problems to display for this patient.  AGorden Harms Kiyan Burmester, PT, DPT #[id number] Patricia Hahn 62017/06/13 9:38 AM  [place] [facility]ICES [address] [place] [address]Phone: [phone]  Fax:  [phone] Name: LKendal GhazarianMRN: [id number] of Birth: [birth year]

## 2015-11-04 ENCOUNTER — Ambulatory Visit: Payer: 59 | Admitting: Physical Therapy

## 2015-11-05 ENCOUNTER — Encounter: Payer: Self-pay | Admitting: Physical Therapy

## 2015-11-05 ENCOUNTER — Ambulatory Visit: Payer: 59 | Attending: Neurology | Admitting: Physical Therapy

## 2015-11-05 DIAGNOSIS — R2681 Unsteadiness on feet: Secondary | ICD-10-CM | POA: Insufficient documentation

## 2015-11-05 DIAGNOSIS — R262 Difficulty in walking, not elsewhere classified: Secondary | ICD-10-CM | POA: Insufficient documentation

## 2015-11-05 NOTE — Therapy (Signed)
[place] MAIN [facility] SERVICES [address] [place], [place], [address] Phone: [phone]   Fax:  [phone]  Physical Therapy Treatment  Patient Details  Name: Patricia Hahn MRN: [id number] Date of Birth: 26-Dec-1945 Referring Provider: Anabel Bene  Encounter Date: 11/05/2015      PT End of Session - 11/05/15 1450    Visit Number 9   Number of Visits 17   Date for PT Re-Evaluation 11/28/15   PT Start Time 0245   PT Stop Time 0330   PT Time Calculation (min) 45 min   Equipment Utilized During Treatment Gait belt   Activity Tolerance Patient tolerated treatment well   Behavior During Therapy [facility] for tasks assessed/performed      History reviewed. No pertinent past medical history.  Past Surgical History  Procedure Laterality Date  . Shoulder acromioplasty    . Orif ankle fracture    . Cervical fusion    . Appendectomy    . Abdominal hysterectomy    . Colonoscopy N/A 10/19/2014    Procedure: COLONOSCOPY;  Surgeon: Hulen Luster, MD;  Location: [facility] ENDOSCOPY;  Service: Gastroenterology;  Laterality: N/A;    There were no vitals filed for this visit.      Subjective Assessment - 11/05/15 1449    Subjective pt reports she feels like she is improving her mobility and strength at home and at work She fell down yesterday at work.    Pertinent History Patient had a right ankle fracture 2 yrs ago, cancer, falls, lives alone   Patient Stated Goals get stronger, reduce falls   Currently in Pain? Yes   Pain Score 5    Pain Location Hip      TM x 3 minutes at 1.0 miles/hour and 1 elevation and reports left knee pain  Nu-step x 5 mintues due to left knee pain in standing standing hip abd with YTB x 20  step ups from floor to 6 inch stool x 20 bilateral Matrix fdw/bwd/ side stepping  Leg press 90 lbs x 20 x 3 sets, heel raises x 20 x 3 Patient needs occasional verbal cueing to improve posture and cueing to correctly perform  exercises slowly, holding at end of range to increase motor firing of desired muscle to encourage fatigue. Patient has increased pain to left knee with TM walking so it was stopped.                                  PT Long Term Goals - 10/31/15 0934    PT LONG TERM GOAL #1   Title Patient will reduce timed up and go to <11 seconds to reduce fall risk and demonstrate improved transfer/gait ability   Time 6   Status Achieved   PT LONG TERM GOAL #2   Title Patient will increase 10 meter walk test to >1.37ms as to improve gait speed for better community ambulation and to reduce fall risk.   Time 6   Status Achieved   PT LONG TERM GOAL #3   Title Patient will increase six minute walk test distance to >1000 for progression to community ambulator and improve gait ability   Baseline 935   Time 6   Period Weeks   Status Partially Met   PT LONG TERM GOAL #4   Title Patient will tolerate 5 seconds of single leg stance without loss of balance to improve  ability to get in and out of shower safely   Baseline 3s   Time 6   Period Weeks   Status Partially Met   PT LONG TERM GOAL #5   Title pt will improve berg balnace score by 6pts demonstrating improved balance    Time 4   Period Weeks   Status New   Additional Long Term Goals   Additional Long Term Goals Yes   PT LONG TERM GOAL #6   Title pt will safely ambulate 135f over uneven surface to work in her yard   Time 4   Period Weeks   Status New   PT LONG TERM GOAL #7   Title pt will score >19/24 on DGI to reduce fall risk   Time 4   Period Weeks   Status New               Plan - 11/05/15 1450    Clinical Impression Statement Pateint has decreased dynamic standing balance deficits and weakness in her LE's and was able to perform intermediate strengthening exercises to improve her mobility.    Rehab Potential Good   Clinical Impairments Affecting Rehab Potential weakness, falls, lives alone   PT  Frequency 2x / week   PT Duration 4 weeks   PT Treatment/Interventions Patient/family education;Neuromuscular re-education;Balance training;Therapeutic exercise;Therapeutic activities;Functional mobility training;Stair training;Gait training;Manual techniques   Consulted and Agree with Plan of Care Patient      Patient will benefit from skilled therapeutic intervention in order to improve the following deficits and impairments:  Abnormal gait, Pain, Decreased mobility, Decreased coordination, Decreased activity tolerance, Decreased balance, Decreased strength, Difficulty walking  Visit Diagnosis: Difficulty in walking, not elsewhere classified  Unsteadiness on feet     Problem List There are no active problems to display for this patient.  KAlanson Puls PT, DPT [place] 11/05/2015, 2:54 PM  [place] CalmMAIN [facility]Franc[facility] CitySERVICES [address] [place] [address]Phone: [phone]  Fax:  [phone] Name: Patricia GoldsmithMRN: [id number] of Birth: 101-Apr-1947

## 2015-11-06 ENCOUNTER — Ambulatory Visit: Payer: 59 | Admitting: Physical Therapy

## 2015-11-07 ENCOUNTER — Ambulatory Visit: Payer: 59 | Admitting: Physical Therapy

## 2015-11-07 DIAGNOSIS — R2681 Unsteadiness on feet: Secondary | ICD-10-CM

## 2015-11-07 DIAGNOSIS — R262 Difficulty in walking, not elsewhere classified: Secondary | ICD-10-CM

## 2015-11-07 NOTE — Therapy (Signed)
[place] MAIN [facility] SERVICES [address] [place], [place], [address] Phone: [phone]   Fax:  [phone]  Physical Therapy Treatment  Patient Details  Name: Patricia Hahn MRN: [id number] Date of Birth: 1946-03-16 Referring Provider: Anabel Bene  Encounter Date: 11/07/2015      PT End of Session - 11/07/15 1452    Visit Number 10   Number of Visits 17   Date for PT Re-Evaluation 11/28/15   PT Start Time 0245   PT Stop Time 0330   PT Time Calculation (min) 45 min   Equipment Utilized During Treatment Gait belt   Activity Tolerance Patient tolerated treatment well   Behavior During Therapy [facility] for tasks assessed/performed      No past medical history on file.  Past Surgical History  Procedure Laterality Date  . Shoulder acromioplasty    . Orif ankle fracture    . Cervical fusion    . Appendectomy    . Abdominal hysterectomy    . Colonoscopy N/A 10/19/2014    Procedure: COLONOSCOPY;  Surgeon: Hulen Luster, MD;  Location: [facility] ENDOSCOPY;  Service: Gastroenterology;  Laterality: N/A;    There were no vitals filed for this visit.    Therapeutic exercise: Side stepping on TM elevation 1 and . 4 miles / hour x 3 minutes each side  Side stepping left and right over 1/2 foam left and right Fwd stepping left and right over 1/2 foam  Tilt board fwd/bwd shift, side to side shifting with and without UE support Leg press x 90 lbs x 20 x 3 and heel raises x 20 x 3  Patient continues to have weakness and unsteady gait and is progressing towards her goals.                              PT Education - 11/07/15 1452    Education provided Yes   Education Details safety with mobility   Person(s) Educated Patient   Methods Explanation   Comprehension Verbalized understanding             PT Long Term Goals - 11/07/15 1453    PT LONG TERM GOAL #1   Title Patient will reduce timed up and go to <11  seconds to reduce fall risk and demonstrate improved transfer/gait ability   Time 6   Period Weeks   Status New   PT LONG TERM GOAL #2   Title Patient will increase 10 meter walk test to >1.34ms as to improve gait speed for better community ambulation and to reduce fall risk.   Status Achieved   PT LONG TERM GOAL #3   Title Patient will increase six minute walk test distance to >1000 for progression to community ambulator and improve gait ability   Baseline 935   Period Weeks   Status Partially Met   PT LONG TERM GOAL #4   Title Patient will tolerate 5 seconds of single leg stance without loss of balance to improve ability to get in and out of shower safely   Baseline 3s   Period Weeks   Status Partially Met   PT LONG TERM GOAL #5   Title pt will improve berg balnace score by 6pts demonstrating improved balance    Time 4   Period Weeks   Status New   PT LONG TERM GOAL #6   Title pt will safely ambulate 1558fover uneven surface  to work in her yard   Time 4   Period Weeks   Status New   PT Ryan #7   Title pt will score >19/24 on DGI to reduce fall risk   Time 4   Period Weeks   Status New               Plan - 11/07/15 1452    Clinical Impression Statement Patient is progressing with outcome measures and towards goals. Patient has weakness in BLE and unsteady gait.    Rehab Potential Good   Clinical Impairments Affecting Rehab Potential weakness, falls, lives alone   PT Frequency 2x / week   PT Duration 4 weeks   PT Treatment/Interventions Patient/family education;Neuromuscular re-education;Balance training;Therapeutic exercise;Therapeutic activities;Functional mobility training;Stair training;Gait training;Manual techniques   Consulted and Agree with Plan of Care Patient      Patient will benefit from skilled therapeutic intervention in order to improve the following deficits and impairments:  Abnormal gait, Pain, Decreased mobility, Decreased  coordination, Decreased activity tolerance, Decreased balance, Decreased strength, Difficulty walking  Visit Diagnosis: Difficulty in walking, not elsewhere classified  Unsteadiness on feet     Problem List There are no active problems to display for this patient.  Alanson Puls, PT, DPT [place] 11/07/2015, 2:56 PM  [place] MAIN [facility] SERVICES [address] [place], [place], [address] Phone: [phone]   Fax:  [phone]  Name: Patricia Hahn MRN: [id number] Date of Birth: 02/07/1946

## 2015-11-11 ENCOUNTER — Ambulatory Visit: Payer: 59 | Admitting: Physical Therapy

## 2015-11-12 ENCOUNTER — Encounter: Payer: Self-pay | Admitting: Physical Therapy

## 2015-11-12 ENCOUNTER — Ambulatory Visit: Payer: 59 | Admitting: Physical Therapy

## 2015-11-12 DIAGNOSIS — R2681 Unsteadiness on feet: Secondary | ICD-10-CM

## 2015-11-12 DIAGNOSIS — R262 Difficulty in walking, not elsewhere classified: Secondary | ICD-10-CM

## 2015-11-12 NOTE — Therapy (Signed)
[place] MAIN [facility] SERVICES [address] [place], [place], [address] Phone: [phone]   Fax:  [phone]  Physical Therapy Treatment  Patient Details  Name: Patricia Hahn MRN: [id number] Date of Birth: 1946-01-19 Referring Provider: Anabel Bene  Encounter Date: 11/12/2015      PT End of Session - 11/12/15 1513    Visit Number 11   Number of Visits 17   Date for PT Re-Evaluation 11/28/15   PT Start Time 1505   PT Stop Time 1543   PT Time Calculation (min) 38 min   Equipment Utilized During Treatment Gait belt   Activity Tolerance Patient tolerated treatment well   Behavior During Therapy [facility] for tasks assessed/performed      History reviewed. No pertinent past medical history.  Past Surgical History  Procedure Laterality Date  . Shoulder acromioplasty    . Orif ankle fracture    . Cervical fusion    . Appendectomy    . Abdominal hysterectomy    . Colonoscopy N/A 10/19/2014    Procedure: COLONOSCOPY;  Surgeon: Hulen Luster, MD;  Location: [facility] ENDOSCOPY;  Service: Gastroenterology;  Laterality: N/A;    There were no vitals filed for this visit.      Subjective Assessment - 11/12/15 1504    Subjective Pt has been very tired this week. She reports she has had alot to do and working alot. No recent falls.    Pertinent History Patient had a right ankle fracture 2 yrs ago, cancer, falls, lives alone   Patient Stated Goals get stronger, reduce falls   Currently in Pain? No/denies      Therex:   B LE standing hip abd, SLR and ext with YTB 2 x 10   Side stepping with YTB at knees 20 ft x2 each direction Forward "wedding march" with YTB at knees 20 ft x4 Wall squats with theraball x15 Mini squats with no UE support x15 B LE Forward and side lunges onto BOSU (dome side) x10 each Eccentric F/B step downs on 6 inch step B LE 2x10 Eccentric side step downs on 6 inch step B LE 2x10  Mod cues for proper technique of  exercises to target appropriate muscles and for slow eccentric contractions.                             PT Education - 11/12/15 1540    Education provided Yes   Education Details importance of strengthening around joints to provide stability and reduce pain   Person(s) Educated Patient   Methods Explanation   Comprehension Verbalized understanding             PT Long Term Goals - 11/07/15 1453    PT LONG TERM GOAL #1   Title Patient will reduce timed up and go to <11 seconds to reduce fall risk and demonstrate improved transfer/gait ability   Time 6   Period Weeks   Status New   PT LONG TERM GOAL #2   Title Patient will increase 10 meter walk test to >1.9ms as to improve gait speed for better community ambulation and to reduce fall risk.   Status Achieved   PT LONG TERM GOAL #3   Title Patient will increase six minute walk test distance to >1000 for progression to community ambulator and improve gait ability   Baseline 935   Period Weeks   Status Partially Met   PT  LONG TERM GOAL #4   Title Patient will tolerate 5 seconds of single leg stance without loss of balance to improve ability to get in and out of shower safely   Baseline 3s   Period Weeks   Status Partially Met   PT LONG TERM GOAL #5   Title pt will improve berg balnace score by 6pts demonstrating improved balance    Time 4   Period Weeks   Status New   PT LONG TERM GOAL #6   Title pt will safely ambulate 174f over uneven surface to work in her yard   Time 4   Period Weeks   Status New   PT LONG TERM GOAL #7   Title pt will score >19/24 on DGI to reduce fall risk   Time 4   Period Weeks   Status New               Plan - 11/12/15 1513    Clinical Impression Statement Pt able to tolerate increased reps of strengthening exercises. She has difficulty standing on the L LE due to knee discomfort. She will benefit from continued hip/knee strengthening, higher level balance  training to progress towards functional goals.    Rehab Potential Good   Clinical Impairments Affecting Rehab Potential weakness, falls, lives alone   PT Frequency 2x / week   PT Duration 4 weeks   PT Treatment/Interventions Patient/family education;Neuromuscular re-education;Balance training;Therapeutic exercise;Therapeutic activities;Functional mobility training;Stair training;Gait training;Manual techniques   Consulted and Agree with Plan of Care Patient      Patient will benefit from skilled therapeutic intervention in order to improve the following deficits and impairments:  Abnormal gait, Pain, Decreased mobility, Decreased coordination, Decreased activity tolerance, Decreased balance, Decreased strength, Difficulty walking  Visit Diagnosis: Difficulty in walking, not elsewhere classified  Unsteadiness on feet     Problem List There are no active problems to display for this patient.   Mansur Patricia Hahn PT, DPT  11/12/2015, 3:45 PM 3[place] [address] [address] [address]Phone: [phone]  Fax:  [phone] Name: LMaybelle DepaoliMRN: [id number] of Birth: [birth year]

## 2015-11-13 ENCOUNTER — Ambulatory Visit: Payer: 59 | Admitting: Physical Therapy

## 2015-11-14 ENCOUNTER — Ambulatory Visit: Payer: 59 | Admitting: Physical Therapy

## 2015-11-18 ENCOUNTER — Ambulatory Visit: Payer: 59 | Admitting: Physical Therapy

## 2015-11-19 ENCOUNTER — Encounter: Payer: Self-pay | Admitting: Physical Therapy

## 2015-11-19 ENCOUNTER — Ambulatory Visit: Payer: 59 | Admitting: Physical Therapy

## 2015-11-19 DIAGNOSIS — R262 Difficulty in walking, not elsewhere classified: Secondary | ICD-10-CM

## 2015-11-19 DIAGNOSIS — R2681 Unsteadiness on feet: Secondary | ICD-10-CM

## 2015-11-19 NOTE — Therapy (Signed)
[place] MAIN [facility] SERVICES [address] [place], [place], [address] Phone: [phone]   Fax:  [phone]  Physical Therapy Treatment  Patient Details  Name: Patricia Hahn MRN: [id number] Date of Birth: 11-08-1945 Referring Provider: Anabel Bene  Encounter Date: 11/19/2015      PT End of Session - 11/19/15 1511    Visit Number 12   Number of Visits 17   Date for PT Re-Evaluation 11/28/15   PT Start Time 6962   PT Stop Time 1527   PT Time Calculation (min) 38 min   Equipment Utilized During Treatment Gait belt   Activity Tolerance Patient tolerated treatment well   Behavior During Therapy [facility] for tasks assessed/performed      History reviewed. No pertinent past medical history.  Past Surgical History  Procedure Laterality Date  . Shoulder acromioplasty    . Orif ankle fracture    . Cervical fusion    . Appendectomy    . Abdominal hysterectomy    . Colonoscopy N/A 10/19/2014    Procedure: COLONOSCOPY;  Surgeon: Hulen Luster, MD;  Location: [facility] ENDOSCOPY;  Service: Gastroenterology;  Laterality: N/A;    There were no vitals filed for this visit.      Subjective Assessment - 11/19/15 1449    Subjective Pt reports she has been doing okay. Still having some L knee pain especially at the end of the day.    Pertinent History Patient had a right ankle fracture 2 yrs ago, cancer, falls, lives alone   Patient Stated Goals get stronger, reduce falls   Currently in Pain? Yes   Pain Score 7   L knee   Pain Descriptors / Indicators Aching   Pain Type Chronic pain   Pain Onset More than a month ago      Assessed pt's L patella and found medial tracking with tightness medial to lateral.  Manual therapy:  L patella mobs medial/lateral 5x30 sec grades III and IV for improved patellar tracking  Ambulation post mobs improved mechanics and 0/10 pain vs 7/10 upon arrival Taping Kinesiology tape applied to L patella to  facilitate neutral tracking during ambulation to reduce pain. Pt to trial tape until the f/u therapy visit this week and assess the effects. Pt instructed to remove if she experiences discomfort.  Therex:  Supine   SLR 2x10  Clamshell with RTB 2x10 Sidelying hip abd 2x10  Mod cues for proper technique of exercises to target appropriate muscles and for slow eccentric contractions for optimal muscle strengthening.                            PT Education - 11/19/15 1510    Education Details heel to toe gait, TKE in gait with L LE   Person(s) Educated Patient   Methods Explanation;Demonstration   Comprehension Verbalized understanding;Returned demonstration             PT Long Term Goals - 11/07/15 1453    PT LONG TERM GOAL #1   Title Patient will reduce timed up and go to <11 seconds to reduce fall risk and demonstrate improved transfer/gait ability   Time 6   Period Weeks   Status New   PT LONG TERM GOAL #2   Title Patient will increase 10 meter walk test to >1.63ms as to improve gait speed for better community ambulation and to reduce fall risk.   Status Achieved  PT LONG TERM GOAL #3   Title Patient will increase six minute walk test distance to >1000 for progression to community ambulator and improve gait ability   Baseline 935   Period Weeks   Status Partially Met   PT LONG TERM GOAL #4   Title Patient will tolerate 5 seconds of single leg stance without loss of balance to improve ability to get in and out of shower safely   Baseline 3s   Period Weeks   Status Partially Met   PT LONG TERM GOAL #5   Title pt will improve berg balnace score by 6pts demonstrating improved balance    Time 4   Period Weeks   Status New   PT LONG TERM GOAL #6   Title pt will safely ambulate 125f over uneven surface to work in her yard   Time 4   Period Weeks   Status New   PT LONG TERM GOAL #7   Title pt will score >19/24 on DGI to reduce fall risk   Time 4    Period Weeks   Status New               Plan - 11/19/15 1513    Clinical Impression Statement Pt assessed pt's L knee. Her patella seemed to be tracking medially. Post medial/lateral mobilizations, pt reported significantly reduced pain. She is going trial her patella being taped until next visit and assess if it was beneficial in reducing pain over the next couple of days. Her L hip muscles are weak, likely causing her to compensate at the knee. She will benefit from continued trial of patella mobilizations and hip/knee strenthening to facilitate proper joint mechanics.   Rehab Potential Good   Clinical Impairments Affecting Rehab Potential weakness, falls, lives alone   PT Frequency 2x / week   PT Duration 4 weeks   PT Treatment/Interventions Patient/family education;Neuromuscular re-education;Balance training;Therapeutic exercise;Therapeutic activities;Functional mobility training;Stair training;Gait training;Manual techniques   PT Next Visit Plan reassess if kinesiotape helped, patella mobs, hip/knee strengthening   PT Home Exercise Plan added SLR   Consulted and Agree with Plan of Care Patient      Patient will benefit from skilled therapeutic intervention in order to improve the following deficits and impairments:  Abnormal gait, Pain, Decreased mobility, Decreased coordination, Decreased activity tolerance, Decreased balance, Decreased strength, Difficulty walking  Visit Diagnosis: Difficulty in walking, not elsewhere classified  Unsteadiness on feet     Problem List There are no active problems to display for this patient.   Marte Celani JShiela Mayer PT, DPT  11/19/2015, 4:59 PM 3[place] RSaint [facility]uther[facility] HospitalSERVICES [address] [address]Phone: [phone]  Fax:  [phone] Name: Patricia HurwitzMRN: [id number] of Birth: [birth year]

## 2015-11-20 ENCOUNTER — Ambulatory Visit: Payer: 59 | Admitting: Physical Therapy

## 2015-11-21 ENCOUNTER — Ambulatory Visit: Payer: 59 | Admitting: Physical Therapy

## 2015-11-21 ENCOUNTER — Encounter: Payer: Self-pay | Admitting: Physical Therapy

## 2015-11-21 DIAGNOSIS — R262 Difficulty in walking, not elsewhere classified: Secondary | ICD-10-CM

## 2015-11-21 DIAGNOSIS — R2681 Unsteadiness on feet: Secondary | ICD-10-CM

## 2015-11-21 NOTE — Therapy (Signed)
[place] MAIN [facility] SERVICES [address] [place], [place], [address] Phone: [phone]   Fax:  [phone]  Physical Therapy Treatment  Patient Details  Name: Patricia Hahn MRN: [id number] Date of Birth: [birth year] Referring Provider: Anabel Bene  Encounter Date: 11/21/2015      PT End of Session - 11/21/15 1446    Visit Number 13   Number of Visits 17   Date for PT Re-Evaluation 11/28/15   PT Start Time 1430   PT Stop Time 1515   PT Time Calculation (min) 45 min   Equipment Utilized During Treatment Gait belt   Activity Tolerance Patient tolerated treatment well   Behavior During Therapy [facility] for tasks assessed/performed      History reviewed. No pertinent past medical history.  Past Surgical History  Procedure Laterality Date  . Shoulder acromioplasty    . Orif ankle fracture    . Cervical fusion    . Appendectomy    . Abdominal hysterectomy    . Colonoscopy N/A 10/19/2014    Procedure: COLONOSCOPY;  Surgeon: Hulen Luster, MD;  Location: [facility] ENDOSCOPY;  Service: Gastroenterology;  Laterality: N/A;    There were no vitals filed for this visit.      Subjective Assessment - 11/21/15 1445    Subjective Pt reports she has been doing okay. Still having some L knee pain especially at the end of the day.    Pertinent History Patient had a right ankle fracture 2 yrs ago, cancer, falls, lives alone   Patient Stated Goals get stronger, reduce falls   Currently in Pain? No/denies   Pain Onset More than a month ago      THEREX Resisted walking fwd, retro, bil side stepping with12.5# x 3 laps each; min A throughout due to pt being unsteady and losing balance multiple times throughout each direction; increased time to complete task  NMR Bil tandem stance on 1/2 foam roll (upsidedown) without UE support, 3 x 1 min each; CGA - min A throughout to maintain balance Bil stance on 1/2 foam roll (upsidedown) without UE support x  3 min; CGA - min A throughout to maintain balance bal on airex pad w/ cone taps (x3 cones) x 5 each without UE support; CGA - min A throughout to maintain balance  GAIT TRAINING gait training on treadmill x 5 min with "gait training" setting 1.25mh, BUE support ; min-mod verbal cues to increase RLE step length; CGA                           PT Education - 11/21/15 1446    Education provided Yes   Education Details safety with mobility   Person(s) Educated Patient   Methods Explanation   Comprehension Verbalized understanding             PT Long Term Goals - 11/07/15 1453    PT LONG TERM GOAL #1   Title Patient will reduce timed up and go to <11 seconds to reduce fall risk and demonstrate improved transfer/gait ability   Time 6   Period Weeks   Status New   PT LONG TERM GOAL #2   Title Patient will increase 10 meter walk test to >1.010m as to improve gait speed for better community ambulation and to reduce fall risk.   Status Achieved   PT LONG TERM GOAL #3   Title Patient will increase six minute walk test distance  to >1000 for progression to community ambulator and improve gait ability   Baseline 935   Period Weeks   Status Partially Met   PT LONG TERM GOAL #4   Title Patient will tolerate 5 seconds of single leg stance without loss of balance to improve ability to get in and out of shower safely   Baseline 3s   Period Weeks   Status Partially Met   PT LONG TERM GOAL #5   Title pt will improve berg balnace score by 6pts demonstrating improved balance    Time 4   Period Weeks   Status New   PT LONG TERM GOAL #6   Title pt will safely ambulate 156f over uneven surface to work in her yard   Time 4   Period Weeks   Status New   PT LONG TERM GOAL #7   Title pt will score >19/24 on DGI to reduce fall risk   Time 4   Period Weeks   Status New               Plan - 11/21/15 1449    Clinical Impression Statement Patient has poor static  and dynamic standing balance and needs frequent use of hands to steady herself and keep from losing her balance.   Rehab Potential Good   Clinical Impairments Affecting Rehab Potential weakness, falls, lives alone   PT Frequency 2x / week   PT Duration 4 weeks   PT Treatment/Interventions Patient/family education;Neuromuscular re-education;Balance training;Therapeutic exercise;Therapeutic activities;Functional mobility training;Stair training;Gait training;Manual techniques   PT Next Visit Plan reassess if kinesiotape helped, patella mobs, hip/knee strengthening   PT Home Exercise Plan added SLR   Consulted and Agree with Plan of Care Patient      Patient will benefit from skilled therapeutic intervention in order to improve the following deficits and impairments:  Abnormal gait, Pain, Decreased mobility, Decreased coordination, Decreased activity tolerance, Decreased balance, Decreased strength, Difficulty walking  Visit Diagnosis: Difficulty in walking, not elsewhere classified  Unsteadiness on feet     Problem List There are no active problems to display for this patient. KAlanson Puls PT, DPT  [place] 11/21/2015, 2:50 PM  [place]MAIN RValley [facility] Winche[facility] CenterSERVICES [address] [place] [address]Phone: [phone]  Fax:  [phone] Name: LClarivel CallawayMRN: [id number] of Birth: [birth year]

## 2015-11-25 ENCOUNTER — Ambulatory Visit: Payer: 59 | Admitting: Physical Therapy

## 2015-11-26 ENCOUNTER — Ambulatory Visit: Payer: 59 | Admitting: Physical Therapy

## 2015-11-26 DIAGNOSIS — R262 Difficulty in walking, not elsewhere classified: Secondary | ICD-10-CM

## 2015-11-26 DIAGNOSIS — R2681 Unsteadiness on feet: Secondary | ICD-10-CM

## 2015-11-26 NOTE — Therapy (Signed)
[place] MAIN [facility] SERVICES [address] [place], [place], [address] Phone: [phone]   Fax:  [phone]  Physical Therapy Treatment  Patient Details  Name: Patricia Hahn MRN: [id number] Date of Birth: 10/10/1945 Referring Provider: Anabel Bene  Encounter Date: 11/26/2015      PT End of Session - 11/26/15 1506    Visit Number 13   Number of Visits 14   Date for PT Re-Evaluation 11/28/15   PT Start Time 0250   PT Stop Time 0330   PT Time Calculation (min) 40 min   Equipment Utilized During Treatment Gait belt   Activity Tolerance Patient tolerated treatment well   Behavior During Therapy [facility] for tasks assessed/performed      No past medical history on file.  Past Surgical History  Procedure Laterality Date  . Shoulder acromioplasty    . Orif ankle fracture    . Cervical fusion    . Appendectomy    . Abdominal hysterectomy    . Colonoscopy N/A 10/19/2014    Procedure: COLONOSCOPY;  Surgeon: Hulen Luster, MD;  Location: [facility] ENDOSCOPY;  Service: Gastroenterology;  Laterality: N/A;    There were no vitals filed for this visit.   Therapeutic exercise:  standing hip abd with YTB x 20  side stepping left and right in parallel bars 10 feet x 3 standing on blue foam with cone reaching x 20 across midline step ups from floor to 6 inch stool x 20 bilateral sit to stand x 10 marching in parallel bars x 20 stepping pattern with weight shifting fwd/bwd x 10.  Leg press LLE only 60 lbs x 20 x 3 Matrix fwd/bwd over step x 10  Side stepping over bolsters and backward stepping over bolsters.   Mod verbal cues used throughout with increased in postural sway and LOB most seen with narrow base of support and while on uneven surfaces. Continues to have balance deficits typical with diagnosis. Patient performs intermediate level exercises without pain behaviors and needs verbal cuing for postural alignment and head  positioning.                             PT Education - 11/26/15 1506    Education provided Yes   Education Details balance and safety   Person(s) Educated Patient   Methods Explanation   Comprehension Verbalized understanding             PT Long Term Goals - 11/07/15 1453    PT LONG TERM GOAL #1   Title Patient will reduce timed up and go to <11 seconds to reduce fall risk and demonstrate improved transfer/gait ability   Time 6   Period Weeks   Status New   PT LONG TERM GOAL #2   Title Patient will increase 10 meter walk test to >1.6ms as to improve gait speed for better community ambulation and to reduce fall risk.   Status Achieved   PT LONG TERM GOAL #3   Title Patient will increase six minute walk test distance to >1000 for progression to community ambulator and improve gait ability   Baseline 935   Period Weeks   Status Partially Met   PT LONG TERM GOAL #4   Title Patient will tolerate 5 seconds of single leg stance without loss of balance to improve ability to get in and out of shower safely   Baseline 3s   Period Weeks  Status Partially Met   PT LONG TERM GOAL #5   Title pt will improve berg balnace score by 6pts demonstrating improved balance    Time 4   Period Weeks   Status New   PT LONG TERM GOAL #6   Title pt will safely ambulate 125f over uneven surface to work in her yard   Time 4   Period Weeks   Status New   PT LONG TERM GOAL #7   Title pt will score >19/24 on DGI to reduce fall risk   Time 4   Period Weeks   Status New               Plan - 11/26/15 1507    Clinical Impression Statement  Muscle fatigue but no major pain complaints. Patient advancing to red theraband for exercises listed above.   Rehab Potential Good   Clinical Impairments Affecting Rehab Potential weakness, falls, lives alone   PT Frequency 2x / week   PT Duration 4 weeks   PT Treatment/Interventions Patient/family education;Neuromuscular  re-education;Balance training;Therapeutic exercise;Therapeutic activities;Functional mobility training;Stair training;Gait training;Manual techniques   PT Next Visit Plan reassess if kinesiotape helped, patella mobs, hip/knee strengthening   PT Home Exercise Plan added SLR   Consulted and Agree with Plan of Care Patient      Patient will benefit from skilled therapeutic intervention in order to improve the following deficits and impairments:  Abnormal gait, Pain, Decreased mobility, Decreased coordination, Decreased activity tolerance, Decreased balance, Decreased strength, Difficulty walking  Visit Diagnosis: Difficulty in walking, not elsewhere classified  Unsteadiness on feet     Problem List There are no active problems to display for this patient. KAlanson Puls PT, DPT  MDrum[place] K[place]S 11/26/2015, 3:10 PM  CTwin LakesMAIN RAmbulatory Surgical Center Of Stevens PointSERVICES [address]RC[address] [place] [address]Phone: [phone]  Fax:  [phone] Name: Patricia BautchMRN: [id number] of Birth: 1Mar 30, 1947

## 2015-11-27 ENCOUNTER — Ambulatory Visit: Payer: 59 | Admitting: Physical Therapy

## 2015-11-28 ENCOUNTER — Encounter: Payer: Self-pay | Admitting: Physical Therapy

## 2015-11-28 ENCOUNTER — Ambulatory Visit: Payer: 59 | Admitting: Physical Therapy

## 2015-11-28 DIAGNOSIS — R2681 Unsteadiness on feet: Secondary | ICD-10-CM

## 2015-11-28 DIAGNOSIS — R262 Difficulty in walking, not elsewhere classified: Secondary | ICD-10-CM

## 2015-11-28 NOTE — Therapy (Signed)
[place] MAIN [facility] SERVICES [address] [place], [place], [address] Phone: [phone]   Fax:  [phone]  Physical Therapy Treatment  Patient Details  Name: Patricia Hahn MRN: [id number] Date of Birth: [birth year] Referring Provider: Anabel Bene  Encounter Date: 11/28/2015      PT End of Session - 11/28/15 1452    Visit Number 14   Number of Visits 14   Date for PT Re-Evaluation 11/28/15   PT Start Time 0245   PT Stop Time 0325   PT Time Calculation (min) 40 min   Equipment Utilized During Treatment Gait belt   Activity Tolerance Patient tolerated treatment well   Behavior During Therapy [facility] for tasks assessed/performed      History reviewed. No pertinent past medical history.  Past Surgical History  Procedure Laterality Date  . Shoulder acromioplasty    . Orif ankle fracture    . Cervical fusion    . Appendectomy    . Abdominal hysterectomy    . Colonoscopy N/A 10/19/2014    Procedure: COLONOSCOPY;  Surgeon: Hulen Luster, MD;  Location: [facility] ENDOSCOPY;  Service: Gastroenterology;  Laterality: N/A;    There were no vitals filed for this visit.      Subjective Assessment - 11/28/15 1451    Subjective Pt reports she has been doing okay. Still having some L knee pain especially at the end of the day. Pateint feels that is beginning to walk better.    Pertinent History Patient had a right ankle fracture 2 yrs ago, cancer, falls, lives alone   Patient Stated Goals get stronger, reduce falls   Currently in Pain? No/denies   Pain Onset More than a month ago     Therapeutic exercise: TM walking with 4 lbs ankle weights, x 1. 8 miles / hour x 10 mins with better coordination TM side stepping left and right with 4 lb on ankle x 3 minutes per side Step ups to 6 inch stool with 4 lbs ankle x 20 Step downs to floor with 4 lb ankle weights x 20  Step over sideways left and right x 10  Marching with 4 lbs ankle weights x  20 Leg press 90 lbs x 20 x 2 Leg press 60 lbs heel raises x 20 x 2 Patient needs occasional verbal cueing to improve posture and cueing to correctly perform exercises slowly, holding at end of range to increase motor firing of desired muscle to encourage fatigue.                             PT Education - 11/28/15 1452    Education provided Yes   Education Details balance and safety   Person(s) Educated Patient   Methods Explanation   Comprehension Verbalized understanding             PT Long Term Goals - 11/07/15 1453    PT LONG TERM GOAL #1   Title Patient will reduce timed up and go to <11 seconds to reduce fall risk and demonstrate improved transfer/gait ability   Time 6   Period Weeks   Status New   PT LONG TERM GOAL #2   Title Patient will increase 10 meter walk test to >1.33ms as to improve gait speed for better community ambulation and to reduce fall risk.   Status Achieved   PT LONG TERM GOAL #3   Title Patient will increase six  minute walk test distance to >1000 for progression to community ambulator and improve gait ability   Baseline 935   Period Weeks   Status Partially Met   PT LONG TERM GOAL #4   Title Patient will tolerate 5 seconds of single leg stance without loss of balance to improve ability to get in and out of shower safely   Baseline 3s   Period Weeks   Status Partially Met   PT LONG TERM GOAL #5   Title pt will improve berg balnace score by 6pts demonstrating improved balance    Time 4   Period Weeks   Status New   PT LONG TERM GOAL #6   Title pt will safely ambulate 158f over uneven surface to work in her yard   Time 4   Period Weeks   Status New   PT LONG TERM GOAL #7   Title pt will score >19/24 on DGI to reduce fall risk   Time 4   Period Weeks   Status New               Plan - 11/28/15 1453    Clinical Impression Statement Pateint has better motor control with foot placement on TM.    Rehab  Potential Good   Clinical Impairments Affecting Rehab Potential weakness, falls, lives alone   PT Frequency 2x / week   PT Duration 4 weeks   PT Treatment/Interventions Patient/family education;Neuromuscular re-education;Balance training;Therapeutic exercise;Therapeutic activities;Functional mobility training;Stair training;Gait training;Manual techniques   PT Next Visit Plan reassess if kinesiotape helped, patella mobs, hip/knee strengthening   PT Home Exercise Plan added SLR   Consulted and Agree with Plan of Care Patient      Patient will benefit from skilled therapeutic intervention in order to improve the following deficits and impairments:  Abnormal gait, Pain, Decreased mobility, Decreased coordination, Decreased activity tolerance, Decreased balance, Decreased strength, Difficulty walking  Visit Diagnosis: Difficulty in walking, not elsewhere classified  Unsteadiness on feet     Problem List There are no active problems to display for this patient. KAlanson Puls PT, DPT  [place]S 11/28/2015, 2:54 PM  CWathaMAIN RGreater Sacramento Surgery CenterSERVICES [address] DriveRFinklea NAl[place] [address]Phone: [phone]  Fax:  [phone] Name: Patricia AronMRN: [id number] of Birth: 111/21/1947

## 2015-12-05 ENCOUNTER — Encounter: Payer: Self-pay | Admitting: Physical Therapy

## 2015-12-05 ENCOUNTER — Ambulatory Visit: Payer: 59 | Attending: Neurology | Admitting: Physical Therapy

## 2015-12-05 DIAGNOSIS — R2681 Unsteadiness on feet: Secondary | ICD-10-CM | POA: Diagnosis present

## 2015-12-05 DIAGNOSIS — R262 Difficulty in walking, not elsewhere classified: Secondary | ICD-10-CM | POA: Insufficient documentation

## 2015-12-05 NOTE — Therapy (Signed)
[place] MAIN [facility] SERVICES [address] [place], [place], [address] Phone: [phone]   Fax:  [phone]  Physical Therapy Treatment  Patient Details  Name: Patricia Hahn MRN: [id number] Date of Birth: 1946/05/19 Referring Provider: Anabel Bene  Encounter Date: 12/05/2015      PT End of Session - 12/05/15 1554    Visit Number 15   Number of Visits 14   Date for PT Re-Evaluation 11/28/15   PT Start Time 0330   PT Stop Time 0415   PT Time Calculation (min) 45 min   Equipment Utilized During Treatment Gait belt   Activity Tolerance Patient tolerated treatment well   Behavior During Therapy [facility] for tasks assessed/performed      History reviewed. No pertinent past medical history.  Past Surgical History  Procedure Laterality Date  . Shoulder acromioplasty    . Orif ankle fracture    . Cervical fusion    . Appendectomy    . Abdominal hysterectomy    . Colonoscopy N/A 10/19/2014    Procedure: COLONOSCOPY;  Surgeon: Hulen Luster, MD;  Location: [facility] ENDOSCOPY;  Service: Gastroenterology;  Laterality: N/A;    There were no vitals filed for this visit.      Subjective Assessment - 12/05/15 1553    Subjective Pt reports she has been doing okay. Still having some L knee pain especially at the end of the day. Pateint feels that is beginning to walk better.    Pertinent History Patient had a right ankle fracture 2 yrs ago, cancer, falls, lives alone   Patient Stated Goals get stronger, reduce falls   Pain Onset More than a month ago        THER-EX  Eccentric step downs x 10 BLE Squats x 10 with 5 sec hold Heel raises x 10 x 2  Resisted side-steeping RTB 4 lengths x 2; Standing mini squats 2 x 10 with RTB around knees to encourage abduction; Sit to stand without UE support 2 x 10; Step-ups to 6" step x 10 bilateral; Quantum leg press 90 lbs x 10;  NEUROMUSCULAR RE-EDUCATION Airex NBOS eyes open/closed x 30 seconds  each; Airex NBOS eyes open horizontal and vertical head turns x 30 seconds; Toe tapping 6 inch stool without UE assist Tandem gait in // bars x 4 laps   Min cueing needed to appropriately perform  tasks with leg, hand, and head position. Decreased coordination demonstrated requiring consistent verbal cueing to correct form. Patient continues to demonstrate some in coordination of movement with select exercises such as stepping backwards. Patient responds well to verbal and tactile cues to correct form and technique.  CGA to SBA for safety with activities.                          PT Education - 12/05/15 1553    Education provided Yes   Education Details balance and safety with mobility   Person(s) Educated Patient   Methods Explanation   Comprehension Verbalized understanding             PT Long Term Goals - 11/07/15 1453    PT LONG TERM GOAL #1   Title Patient will reduce timed up and go to <11 seconds to reduce fall risk and demonstrate improved transfer/gait ability   Time 6   Period Weeks   Status New   PT LONG TERM GOAL #2   Title Patient will increase 10  meter walk test to >1.13ms as to improve gait speed for better community ambulation and to reduce fall risk.   Status Achieved   PT LONG TERM GOAL #3   Title Patient will increase six minute walk test distance to >1000 for progression to community ambulator and improve gait ability   Baseline 935   Period Weeks   Status Partially Met   PT LONG TERM GOAL #4   Title Patient will tolerate 5 seconds of single leg stance without loss of balance to improve ability to get in and out of shower safely   Baseline 3s   Period Weeks   Status Partially Met   PT LONG TERM GOAL #5   Title pt will improve berg balnace score by 6pts demonstrating improved balance    Time 4   Period Weeks   Status New   PT LONG TERM GOAL #6   Title pt will safely ambulate 1585fover uneven surface to work in her yard   Time 4    Period Weeks   Status New   PT LONG TERM GOAL #7   Title pt will score >19/24 on DGI to reduce fall risk   Time 4   Period Weeks   Status New               Plan - 12/05/15 1554    Clinical Impression Statement Patient has R ankle iweakness during high level dynamic balance activities.    Rehab Potential Good   Clinical Impairments Affecting Rehab Potential weakness, falls, lives alone   PT Frequency 2x / week   PT Duration 4 weeks   PT Treatment/Interventions Patient/family education;Neuromuscular re-education;Balance training;Therapeutic exercise;Therapeutic activities;Functional mobility training;Stair training;Gait training;Manual techniques   PT Next Visit Plan reassess if kinesiotape helped, patella mobs, hip/knee strengthening   PT Home Exercise Plan added SLR   Consulted and Agree with Plan of Care Patient      Patient will benefit from skilled therapeutic intervention in order to improve the following deficits and impairments:  Abnormal gait, Pain, Decreased mobility, Decreased coordination, Decreased activity tolerance, Decreased balance, Decreased strength, Difficulty walking  Visit Diagnosis: Difficulty in walking, not elsewhere classified  Unsteadiness on feet     Problem List There are no active problems to display for this patient. KrAlanson PulsPT, DPT  MaHanksvilleKrConne[place] 12/05/2015, 4:22 PM  CoWestvilleAIN REWestern State HospitalERVICES [address] Point[place]hone: [phone] Fax:  [phone]: Patricia KarbowskiRN: [id number] of Birth: [birth year]

## 2015-12-11 ENCOUNTER — Ambulatory Visit: Payer: 59 | Admitting: Physical Therapy

## 2015-12-11 DIAGNOSIS — R2681 Unsteadiness on feet: Secondary | ICD-10-CM

## 2015-12-11 DIAGNOSIS — R262 Difficulty in walking, not elsewhere classified: Secondary | ICD-10-CM

## 2015-12-11 NOTE — Therapy (Signed)
[place] MAIN [facility] SERVICES [address] [place], [place], [address] Phone: [phone]   Fax:  [phone]  Physical Therapy Treatment  Patient Details  Name: Patricia Hahn MRN: [id number] Date of Birth: 10-20-1945 Referring Provider: Anabel Bene  Encounter Date: 12/11/2015      PT End of Session - 12/11/15 1738    Visit Number 16   Number of Visits 14   Date for PT Re-Evaluation 11/28/15   PT Start Time 5051   PT Stop Time 1807   PT Time Calculation (min) 38 min   Equipment Utilized During Treatment Gait belt   Activity Tolerance Patient tolerated treatment well   Behavior During Therapy [facility] for tasks assessed/performed      History reviewed. No pertinent past medical history.  Past Surgical History  Procedure Laterality Date  . Shoulder acromioplasty    . Orif ankle fracture    . Cervical fusion    . Appendectomy    . Abdominal hysterectomy    . Colonoscopy N/A 10/19/2014    Procedure: COLONOSCOPY;  Surgeon: Hulen Luster, MD;  Location: [facility] ENDOSCOPY;  Service: Gastroenterology;  Laterality: N/A;    There were no vitals filed for this visit.      Subjective Assessment - 12/11/15 1735    Subjective Pt reports she is doing better. She was even able to work all day today and experience no pain.   Pertinent History Patient had a right ankle fracture 2 yrs ago, cancer, falls, lives alone   Patient Stated Goals get stronger, reduce falls   Currently in Pain? No/denies   Pain Onset More than a month ago      Therex:  Supine               SLR  with 2# 2x10 L LE             Clamshell with RTB 3x10  SAQs with 7# 2x10 Sidelying hip abd  with 2# 2x10 L LE Prone hip extension 2x10 Bridges DL 2x10, L SL 2x10 Standing TKE with ball 10 x 10 sec Wall squats with physioball 15 x 5 second hold Standing B LE hip SLR, abd and ext with GTB 2x10 Mod cues for proper technique of exercises to target appropriate muscles and  slow eccentric contractions for most effective strengthening.                            PT Education - 12/11/15 1737    Education provided Yes   Education Details importance of strengthening TKE for knee stability   Person(s) Educated Patient   Methods Explanation   Comprehension Verbalized understanding             PT Long Term Goals - 11/27/15 1651    PT LONG TERM GOAL #1   Title Patient will reduce timed up and go to <11 seconds to reduce fall risk and demonstrate improved transfer/gait ability   Time 6   Period Weeks   Status Partially Met   PT LONG TERM GOAL #2   Title Patient will increase 10 meter walk test to >1.25ms as to improve gait speed for better community ambulation and to reduce fall risk.   Status Achieved   PT LONG TERM GOAL #3   Title Patient will increase six minute walk test distance to >1000 for progression to community ambulator and improve gait ability   Baseline 935  Period Weeks   Status Partially Met   PT LONG TERM GOAL #4   Title Patient will tolerate 5 seconds of single leg stance without loss of balance to improve ability to get in and out of shower safely   Baseline 3s   Period Weeks   PT LONG TERM GOAL #5   Title pt will improve berg balnace score by 6pts demonstrating improved balance    Time 4   Period Weeks   Status New   PT LONG TERM GOAL #6   Title pt will safely ambulate 143f over uneven surface to work in her yard   Time 4   Period Weeks   Status Partially Met   PT LONG TERM GOAL #7   Title pt will score >19/24 on DGI to reduce fall risk   Time 4   Period Weeks   Status Partially Met               Plan - 12/11/15 1744    Clinical Impression Statement Pt gradually progressing towards funcitonal goals. She is able to complete a day of work without pain now. She continues to have core, hip, knee and ankle weakness effecting her gait mechanics. Pt will benefit from continued strengthening, gait  and balance training to progress further towards functional goals.   Rehab Potential Good   Clinical Impairments Affecting Rehab Potential weakness, falls, lives alone   PT Frequency 2x / week   PT Duration 4 weeks   PT Treatment/Interventions Patient/family education;Neuromuscular re-education;Balance training;Therapeutic exercise;Therapeutic activities;Functional mobility training;Stair training;Gait training;Manual techniques   PT Next Visit Plan reassess if kinesiotape helped, patella mobs, hip/knee strengthening   PT Home Exercise Plan added SLR   Consulted and Agree with Plan of Care Patient      Patient will benefit from skilled therapeutic intervention in order to improve the following deficits and impairments:  Abnormal gait, Pain, Decreased mobility, Decreased coordination, Decreased activity tolerance, Decreased balance, Decreased strength, Difficulty walking  Visit Diagnosis: Difficulty in walking, not elsewhere classified  Unsteadiness on feet     Problem List There are no active problems to display for this patient.   Tamer Baughman JShiela Mayer PT, DPT  12/11/2015, 6:16 PM 3MintoMAIN RSaint Anthony Medical CenterSERVICES [address] [place] [address]Phone: [phone]  Fax:  [phone] Name: Patricia HaiderMRN: [id number] of Birth: 1Apr 12, 1947

## 2015-12-11 NOTE — Addendum Note (Signed)
Addended by: Ezekiel InaMANSFIELD, Giselle Brutus S on: 12/11/2015 04:57 PM   Modules accepted: Orders

## 2015-12-17 ENCOUNTER — Ambulatory Visit: Payer: 59 | Admitting: Physical Therapy

## 2015-12-17 ENCOUNTER — Encounter: Payer: Self-pay | Admitting: Physical Therapy

## 2015-12-17 DIAGNOSIS — R262 Difficulty in walking, not elsewhere classified: Secondary | ICD-10-CM

## 2015-12-17 DIAGNOSIS — R2681 Unsteadiness on feet: Secondary | ICD-10-CM

## 2015-12-17 NOTE — Therapy (Signed)
[place] MAIN [facility] SERVICES [address] [place], [place], [address] Phone: [phone]   Fax:  [phone]  Physical Therapy Treatment  Patient Details  Name: Patricia Hahn MRN: [id number] Date of Birth: 1945-10-16 Referring Provider: Anabel Bene  Encounter Date: 12/17/2015      PT End of Session - 12/17/15 1651    Visit Number 17   Number of Visits 14   Date for PT Re-Evaluation 11/28/15   PT Start Time 0445   PT Stop Time 0530   PT Time Calculation (min) 45 min   Equipment Utilized During Treatment Gait belt   Activity Tolerance Patient tolerated treatment well   Behavior During Therapy [facility] for tasks assessed/performed      History reviewed. No pertinent past medical history.  Past Surgical History  Procedure Laterality Date  . Shoulder acromioplasty    . Orif ankle fracture    . Cervical fusion    . Appendectomy    . Abdominal hysterectomy    . Colonoscopy N/A 10/19/2014    Procedure: COLONOSCOPY;  Surgeon: Hulen Luster, MD;  Location: [facility] ENDOSCOPY;  Service: Gastroenterology;  Laterality: N/A;    There were no vitals filed for this visit.      Subjective Assessment - 12/17/15 1650    Subjective Pt reports she is doing better. She was even able to work all day today and experience no pain.   Pertinent History Patient had a right ankle fracture 2 yrs ago, cancer, falls, lives alone   Patient Stated Goals get stronger, reduce falls   Currently in Pain? No/denies   Pain Onset More than a month ago      NEUROMUSCULAR RE-EDUCATION Airex NBOS eyes open/closed x 30 seconds each; Airex NBOS eyes open horizontal and vertical head turns x 30 seconds; Airex cone  reaching crossing midline  Toe tapping 6 inch stool without UE assist Tandem gait in // bars x 4 laps  Side stepping on blue  foam balance beam x 5 lengths of the parallel bars  4 square fwd/bwd, side to side stepping/ diagonal stepping Standing on foam  NBOS  sorting balls/ sorting shapes reaching  Therapeutic exercise; Leg press x 20 x 3 130 lbs,  heel raises with 90 lbs x 20 x 3 Heel raises standing  4 way hip RTB x 20 CGA and Min to mod verbal cues used throughout with increased in postural sway and LOB most seen with narrow base of support and while on uneven surfaces. Continues to have balance deficits typical with diagnosis. Patient performs intermediate level exercises without pain behaviors and needs verbal cuing for postural alignment and head positioning.                           PT Education - 12/17/15 1651    Education provided Yes   Education Details HEP   Person(s) Educated Patient   Methods Explanation   Comprehension Verbalized understanding             PT Long Term Goals - 12/05/15 1633    PT LONG TERM GOAL #1   Title Patient will reduce timed up and go to <11 seconds to reduce fall risk and demonstrate improved transfer/gait ability   Period Weeks   Status Partially Met   PT LONG TERM GOAL #2   Title Patient will increase 10 meter walk test to >1.52ms as to improve gait speed for better community  ambulation and to reduce fall risk.   Time 6   Period Weeks   Status Achieved   PT LONG TERM GOAL #3   Title Patient will increase six minute walk test distance to >1000 for progression to community ambulator and improve gait ability   Baseline 935   Time 6   Period Weeks   Status Partially Met   PT LONG TERM GOAL #4   Title Patient will tolerate 5 seconds of single leg stance without loss of balance to improve ability to get in and out of shower safely   Baseline 3s   Period Weeks   Status Partially Met   PT LONG TERM GOAL #5   Title pt will improve berg balnace score by 6pts demonstrating improved balance    Time 4   Period Weeks   Status New   PT LONG TERM GOAL #6   Title pt will safely ambulate 144f over uneven surface to work in her yard   Time 4   Period Weeks   Status  Partially Met   PT LONG TERM GOAL #7   Title pt will score >19/24 on DGI to reduce fall risk   Time 4   Period Weeks               Plan - 12/17/15 1651    Clinical Impression Statement Patient continues to have unsteady gait and uncoordinated steps with poor motor control for walking but is gradually getting stronger.    Rehab Potential Good   Clinical Impairments Affecting Rehab Potential weakness, falls, lives alone   PT Frequency 2x / week   PT Duration 4 weeks   PT Treatment/Interventions Patient/family education;Neuromuscular re-education;Balance training;Therapeutic exercise;Therapeutic activities;Functional mobility training;Stair training;Gait training;Manual techniques   PT Next Visit Plan reassess if kinesiotape helped, patella mobs, hip/knee strengthening   PT Home Exercise Plan added SLR   Consulted and Agree with Plan of Care Patient      Patient will benefit from skilled therapeutic intervention in order to improve the following deficits and impairments:  Abnormal gait, Pain, Decreased mobility, Decreased coordination, Decreased activity tolerance, Decreased balance, Decreased strength, Difficulty walking  Visit Diagnosis: Difficulty in walking, not elsewhere classified  Unsteadiness on feet     Problem List There are no active problems to display for this patient. KAlanson Hahn PT, DPT  MBelle [place]re KConne[place]S 12/17/2015, 4:53 PM  CGlenwoodMAIN RBarnes-Jewish St. Peters HospitalSERVICES [address] [place] [address]Phone: [phone]  Fax:  [phone] Name: LLoretta DouttMRN: [id number] of Birth: 109/06/1945

## 2015-12-19 ENCOUNTER — Encounter: Payer: Self-pay | Admitting: Physical Therapy

## 2015-12-19 ENCOUNTER — Ambulatory Visit: Payer: 59 | Admitting: Physical Therapy

## 2015-12-19 DIAGNOSIS — R262 Difficulty in walking, not elsewhere classified: Secondary | ICD-10-CM | POA: Diagnosis not present

## 2015-12-19 DIAGNOSIS — R2681 Unsteadiness on feet: Secondary | ICD-10-CM

## 2015-12-19 NOTE — Therapy (Signed)
[place] MAIN [facility] SERVICES [address] [place], [place], [address] Phone: [phone]   Fax:  [phone]  Physical Therapy Treatment  Patient Details  Name: Patricia Hahn MRN: [id number] Date of Birth: 26-Dec-1945 Referring Provider: Anabel Bene  Encounter Date: 12/19/2015      PT End of Session - 12/19/15 1526    Visit Number 18   Number of Visits 14   Date for PT Re-Evaluation 11/28/15   PT Start Time 0320   PT Stop Time 0400   PT Time Calculation (min) 40 min   Equipment Utilized During Treatment Gait belt   Activity Tolerance Patient tolerated treatment well   Behavior During Therapy [facility] for tasks assessed/performed      History reviewed. No pertinent past medical history.  Past Surgical History  Procedure Laterality Date  . Shoulder acromioplasty    . Orif ankle fracture    . Cervical fusion    . Appendectomy    . Abdominal hysterectomy    . Colonoscopy N/A 10/19/2014    Procedure: COLONOSCOPY;  Surgeon: Hulen Luster, MD;  Location: [facility] ENDOSCOPY;  Service: Gastroenterology;  Laterality: N/A;    There were no vitals filed for this visit.      Subjective Assessment - 12/19/15 1526    Subjective Pt reports she is doing better. She was even able to work all day today and experience no pain.   Pertinent History Patient had a right ankle fracture 2 yrs ago, cancer, falls, lives alone   Patient Stated Goals get stronger, reduce falls   Currently in Pain? No/denies   Pain Onset More than a month ago      Therapeutic exercises and neuromuscular training: 1/2 foam feet together and feet apart with head turns Step ups to 6 inch stool x 20 left foot and right foot Standing on foam and tapping 6 inch stool Sit to stand with UE support x 10 and holding ball x 10  Side stepping on blue balance beam x 5  Tm x 5 minutes at . 7 m/sec  Patient demonstrates less incoordination of movement with select exercises such  as side stepping stepping Patient responds well to verbal and tactile cues to correct form and technique. Muscle fatigue but no major pain complaints.                           PT Education - 12/19/15 1526    Education provided Yes   Education Details HEP   Person(s) Educated Patient   Methods Explanation   Comprehension Verbalized understanding             PT Long Term Goals - 12/05/15 1633    PT LONG TERM GOAL #1   Title Patient will reduce timed up and go to <11 seconds to reduce fall risk and demonstrate improved transfer/gait ability   Period Weeks   Status Partially Met   PT LONG TERM GOAL #2   Title Patient will increase 10 meter walk test to >1.59ms as to improve gait speed for better community ambulation and to reduce fall risk.   Time 6   Period Weeks   Status Achieved   PT LONG TERM GOAL #3   Title Patient will increase six minute walk test distance to >1000 for progression to community ambulator and improve gait ability   Baseline 935   Time 6   Period Weeks   Status Partially Met  PT LONG TERM GOAL #4   Title Patient will tolerate 5 seconds of single leg stance without loss of balance to improve ability to get in and out of shower safely   Baseline 3s   Period Weeks   Status Partially Met   PT LONG TERM GOAL #5   Title pt will improve berg balnace score by 6pts demonstrating improved balance    Time 4   Period Weeks   Status New   PT LONG TERM GOAL #6   Title pt will safely ambulate 180f over uneven surface to work in her yard   Time 4   Period Weeks   Status Partially Met   PT LONG TERM GOAL #7   Title pt will score >19/24 on DGI to reduce fall risk   Time 4   Period Weeks               Plan - 12/19/15 1527    Clinical Impression Statement Patient is having deficits in dynamic standing and static standing and performs balance exercises and strengthening exercises wihtout pain behaviors.    Rehab Potential Good    Clinical Impairments Affecting Rehab Potential weakness, falls, lives alone   PT Frequency 2x / week   PT Duration 4 weeks   PT Treatment/Interventions Patient/family education;Neuromuscular re-education;Balance training;Therapeutic exercise;Therapeutic activities;Functional mobility training;Stair training;Gait training;Manual techniques   PT Next Visit Plan reassess if kinesiotape helped, patella mobs, hip/knee strengthening   PT Home Exercise Plan added SLR   Consulted and Agree with Plan of Care Patient      Patient will benefit from skilled therapeutic intervention in order to improve the following deficits and impairments:  Abnormal gait, Pain, Decreased mobility, Decreased coordination, Decreased activity tolerance, Decreased balance, Decreased strength, Difficulty walking  Visit Diagnosis: Difficulty in walking, not elsewhere classified  Unsteadiness on feet     Problem List There are no active problems to display for this patient. KAlanson Puls PT, DPT  [place] 12/19/2015, 3:31 PM  CBurgoonMAIN RDavita Medical GroupSERVICES [address]RLake City [place] [address]Phone: [phone]  Fax:  [phone] Name: Patricia BelsonMRN: [id number] of Birth: [birth year]

## 2015-12-23 ENCOUNTER — Ambulatory Visit: Payer: 59 | Admitting: Physical Therapy

## 2015-12-23 ENCOUNTER — Encounter: Payer: Self-pay | Admitting: Physical Therapy

## 2015-12-23 DIAGNOSIS — R2681 Unsteadiness on feet: Secondary | ICD-10-CM

## 2015-12-23 DIAGNOSIS — R262 Difficulty in walking, not elsewhere classified: Secondary | ICD-10-CM

## 2015-12-23 NOTE — Therapy (Signed)
[place] MAIN [facility] SERVICES [address] [place], [place], [address] Phone: [phone]   Fax:  [phone]  Physical Therapy Treatment  Patient Details  Name: Patricia Hahn MRN: [id number] Date of Birth: Dec 20, 1945 Referring Provider: Anabel Bene  Encounter Date: 12/23/2015      PT End of Session - 12/23/15 1522    Visit Number 20   Number of Visits 14   Date for PT Re-Evaluation 11/28/15   PT Start Time 0315   PT Stop Time 0400   PT Time Calculation (min) 45 min   Equipment Utilized During Treatment Gait belt   Activity Tolerance Patient tolerated treatment well   Behavior During Therapy [facility] for tasks assessed/performed      History reviewed. No pertinent past medical history.  Past Surgical History:  Procedure Laterality Date  . ABDOMINAL HYSTERECTOMY    . APPENDECTOMY    . CERVICAL FUSION    . COLONOSCOPY N/A 10/19/2014   Procedure: COLONOSCOPY;  Surgeon: Hulen Luster, MD;  Location: [facility] ENDOSCOPY;  Service: Gastroenterology;  Laterality: N/A;  . ORIF ANKLE FRACTURE    . SHOULDER ACROMIOPLASTY      There were no vitals filed for this visit.      Subjective Assessment - 12/23/15 1521    Subjective Pt reports she is doing better. She was even able to work all day today and experience no pain.   Pertinent History Patient had a right ankle fracture 2 yrs ago, cancer, falls, lives alone   Patient Stated Goals get stronger, reduce falls   Currently in Pain? 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   Therapeutic ativities:  Outcome measures performed with progress towards berg/ DGI/ TUG/ 10 MW/ 6 MW .   Discussed DC after next session to a gym  for HEP.                         PT Education - 12/23/15 1522    Education provided Yes   Education Details HEP   Person(s) Educated Patient   Methods Explanation   Comprehension Verbalized understanding             PT Long Term Goals - 12/23/15 1524      PT LONG TERM GOAL #1   Title Patient will reduce timed up and go to <11 seconds to reduce fall risk and demonstrate improved transfer/gait ability   Baseline 9.94   Time 8   Period Weeks   Status Achieved     PT LONG TERM GOAL #2   Title Patient will increase 10 meter walk test to >1.32ms as to improve gait speed for better community ambulation and to reduce fall risk.   Baseline .82 m/sec   Time 8   Period Weeks   Status Partially Met     PT LONG TERM GOAL #3   Title Patient will increase six minute walk test distance to >1000 for progression to community ambulator and improve gait ability   Baseline 1020 feet   Status Achieved     PT LONG TERM GOAL #4   Title Patient will  tolerate 5 seconds of single leg stance without loss of balance to improve ability to get in and out of shower safely   Baseline 5 sec   Status Achieved     PT LONG TERM GOAL #5   Title pt will improve berg balnace score by 6pts demonstrating improved balance    Baseline 55/56   Status Achieved     PT LONG TERM GOAL #6   Title pt will safely ambulate 12f over uneven surface to work in her yard   Status Achieved     PT LONG TERM GOAL #7   Title pt will score >19/24 on DGI to reduce fall risk   Baseline 20/24   Time 8   Status Partially Met               Plan - [id number]    Clinical Impression Statement VC to eccentrically control leg press and avoid knee hyperextension. Strength is improving but needs assistance to keep form and obtain the proper position on the weighted machines.   Rehab Potential Good   Clinical Impairments Affecting Rehab Potential weakness, falls, lives alone   PT Frequency 2x /  week   PT Duration 4 weeks   PT Treatment/Interventions Patient/family education;Neuromuscular re-education;Balance training;Therapeutic exercise;Therapeutic activities;Functional mobility training;Stair training;Gait training;Manual techniques   PT Next Visit Plan reassess if kinesiotape helped, patella mobs, hip/knee strengthening   PT Home Exercise Plan added SLR   Consulted and Agree with Plan of Care Patient      Patient will benefit from skilled therapeutic intervention in order to improve the following deficits and impairments:  Abnormal gait, Pain, Decreased mobility, Decreased coordination, Decreased activity tolerance, Decreased balance, Decreased strength, Difficulty walking  Visit Diagnosis: Difficulty in walking, not elsewhere classified  Unsteadiness on feet       G-Codes - 0August 18, 20171610    Functional Assessment Tool Used berg/126mlk/5xsittostand   Functional Limitation Mobility: Walking and moving around   Mobility: Walking and Moving Around Current Status ([id number] least 20 percent but less than 40 percent impaired, limited or restricted   Mobility: Walking and Moving Around Goal Status ([id number] least 1 percent but less than 20 percent impaired, limited or restricted      Problem List There are no active problems to display for this patient. KrAlanson PulsPT, DPT  [place] 11/2015-08-184:11 PM  CoHendricksAIN RESunset Ridge Surgery [facility] LLCERVICES [address]rivedMargaretville[place]hone: [phone] Fax:  [phone]: Patricia FarajRN: [id number] of Birth: 1005-08-1945

## 2015-12-25 ENCOUNTER — Ambulatory Visit: Payer: 59 | Admitting: Physical Therapy

## 2015-12-25 DIAGNOSIS — R262 Difficulty in walking, not elsewhere classified: Secondary | ICD-10-CM

## 2015-12-25 DIAGNOSIS — R2681 Unsteadiness on feet: Secondary | ICD-10-CM

## 2015-12-25 NOTE — Therapy (Addendum)
[place] MAIN [facility] SERVICES [address] [place], [place], [address] Phone: [phone]   Fax:  [phone]  Physical Therapy Treatment  Patient Details  Name: Patricia Hahn MRN: [id number] Date of Birth: 04/10/1946 Referring Provider: Anabel Bene  Encounter Date: 12/25/2015      PT End of Session - 12/25/15 1525    Visit Number 1   Number of Visits 14   Date for PT Re-Evaluation 01/22/16   PT Start Time 0315   PT Stop Time 0400   PT Time Calculation (min) 45 min   Equipment Utilized During Treatment Gait belt   Activity Tolerance Patient tolerated treatment well   Behavior During Therapy [facility] for tasks assessed/performed      No past medical history on file.  Past Surgical History:  Procedure Laterality Date  . ABDOMINAL HYSTERECTOMY    . APPENDECTOMY    . CERVICAL FUSION    . COLONOSCOPY N/A 10/19/2014   Procedure: COLONOSCOPY;  Surgeon: Hulen Luster, MD;  Location: [facility] ENDOSCOPY;  Service: Gastroenterology;  Laterality: N/A;  . ORIF ANKLE FRACTURE    . SHOULDER ACROMIOPLASTY      There were no vitals filed for this visit.      Subjective Assessment - 12/25/15 1524    Subjective Pt reports she is doing better.    Pertinent History Patient had a right ankle fracture 2 yrs ago, cancer, falls, lives alone   Patient Stated Goals get stronger, reduce falls   Currently in Pain? No/denies   Pain Score 0-No pain   Pain Onset More than a month ago     Therapeutic exercise and NMR: TM walking elevation 2 and 1.3 miles/hour x 5 mins TM . 4 miles / hour x 2 mins left and 2 mins right with UE support Leg press 90 lbs x 20 x 2 1/2 foam feet apart and feet together Side stepping on blue foam balance beam left and right with UE Standing on blue disk in parallel bars 1/2 foam tandem with ball  Toe tapping from blue foam to step stool  Patient needs occasional verbal cueing to improve posture and cueing to correctly  perform exercises slowly, holding at end of range to increase motor firing of desired muscle to encourage fatigue.                               PT Education - 12/25/15 1525    Education provided Yes   Education Details reviiewed HEP   Person(s) Educated Patient   Methods Explanation   Comprehension Verbalized understanding             PT Long Term Goals - 12/23/15 1524      PT LONG TERM GOAL #1   Title Patient will reduce timed up and go to <11 seconds to reduce fall risk and demonstrate improved transfer/gait ability   Baseline 9.94   Time 8   Period Weeks   Status Achieved     PT LONG TERM GOAL #2   Title Patient will increase 10 meter walk test to >1.23ms as to improve gait speed for better community ambulation and to reduce fall risk.   Baseline .82 m/sec   Time 8   Period Weeks   Status Partially Met     PT LONG TERM GOAL #3   Title Patient will increase six minute walk test distance to >1000 for progression to  community ambulator and improve gait ability   Baseline 1020 feet   Status Achieved     PT LONG TERM GOAL #4   Title Patient will tolerate 5 seconds of single leg stance without loss of balance to improve ability to get in and out of shower safely   Baseline 5 sec   Status Achieved     PT LONG TERM GOAL #5   Title pt will improve berg balnace score by 6pts demonstrating improved balance    Baseline 55/56   Status Achieved     PT LONG TERM GOAL #6   Title pt will safely ambulate 165f over uneven surface to work in her yard   Status Achieved     PT LONG TERM GOAL #7   Title pt will score >19/24 on DGI to reduce fall risk   Baseline 20/24   Time 8   Status Partially Met               Plan - 12/25/15 1527    Clinical Impression Statement Patient is improving with standing balance and LE strength. She is able to perform all exercises without fatigue or pain behaviors.    Rehab Potential Good   Clinical Impairments  Affecting Rehab Potential weakness, falls, lives alone   PT Frequency 2x / week   PT Duration 4 weeks   PT Treatment/Interventions Patient/family education;Neuromuscular re-education;Balance training;Therapeutic exercise;Therapeutic activities;Functional mobility training;Stair training;Gait training;Manual techniques   PT Next Visit Plan reassess if kinesiotape helped, patella mobs, hip/knee strengthening   PT Home Exercise Plan added SLR   Consulted and Agree with Plan of Care Patient      Patient will benefit from skilled therapeutic intervention in order to improve the following deficits and impairments:  Abnormal gait, Pain, Decreased mobility, Decreased coordination, Decreased activity tolerance, Decreased balance, Decreased strength, Difficulty walking  Visit Diagnosis: Difficulty in walking, not elsewhere classified  Unsteadiness on feet     Problem List There are no active problems to display for this patient. Patricia Hahn PT, DPT  [place]S 12/25/2015, 3:29 PM  CEast Los AngelesMAIN RCanyon View Surgery Center LLCSERVICES [address] [address]Phone: [phone]  Fax:  [phone] Name: Patricia PillayMRN: [id number] of Birth: 125-Jun-1947

## 2016-03-14 ENCOUNTER — Encounter: Payer: Self-pay | Admitting: Emergency Medicine

## 2016-03-14 ENCOUNTER — Emergency Department
Admission: EM | Admit: 2016-03-14 | Discharge: 2016-03-14 | Disposition: A | Discharge status: Home / Self Care | Payer: 59 | Attending: Emergency Medicine | Admitting: Emergency Medicine

## 2016-03-14 DIAGNOSIS — T7840XA Allergy, unspecified, initial encounter: Secondary | ICD-10-CM

## 2016-03-14 DIAGNOSIS — L500 Allergic urticaria: Secondary | ICD-10-CM | POA: Diagnosis not present

## 2016-03-14 DIAGNOSIS — R21 Rash and other nonspecific skin eruption: Secondary | ICD-10-CM | POA: Diagnosis present

## 2016-03-14 DIAGNOSIS — L509 Urticaria, unspecified: Secondary | ICD-10-CM

## 2016-03-14 DIAGNOSIS — Z791 Long term (current) use of non-steroidal anti-inflammatories (NSAID): Secondary | ICD-10-CM | POA: Insufficient documentation

## 2016-03-14 DIAGNOSIS — Z87891 Personal history of nicotine dependence: Secondary | ICD-10-CM | POA: Diagnosis not present

## 2016-03-14 MED ORDER — PREDNISONE 10 MG PO TABS: ORAL_TABLET | ORAL | 0 refills | Status: DC

## 2016-03-14 MED ORDER — HYDROXYZINE PAMOATE 25 MG PO CAPS: 25.0000 mg | ORAL_CAPSULE | ORAL | 0 refills | Status: DC | PRN

## 2016-03-14 NOTE — ED Provider Notes (Signed)
[facility] Emergency Department Provider Note  ____________________________________________  Time seen: Approximately 4:07 PM  I have reviewed the triage vital signs and the nursing notes.   HISTORY  Chief Complaint Rash    HPI Patricia Hahn is a 70 y.o. female , NAD, presents to emergency with four-day history of rash. Patient states she noted red, itchy rash about her lower legs Thursday evening. Rash then spread and was hive-like throughout her body. Was seen by her primary care provider on Friday, started on prednisone. She states she has taken the prednisone as prescribed but notes that the rash seems to worsen at night. She called her primary care provider this morning who instructed her to take 2 more tablets of prednisone and start Benadryl. Patient notes that the rash has improved since this morning and since she began taking the Benadryl. Has taken 25 mg of Benadryl twice today with decrease in itching and rash. Denies any chest pain, shortness of breath, wheezing, difficulty swallowing. Has been able to eat and drink well. Has had no fevers, chills, body aches. Denies any abdominal pain, nausea or vomiting. No numbness, weakness, tingling. Has not noted any skin sores, open wounds, oozing or weeping of the rash. States that the rash is not painful nor is it tingling. No changes in medications, lotions, soaps, detergents, no new foods or beverages.   History reviewed. No pertinent past medical history.  There are no active problems to display for this patient.   Past Surgical History:  Procedure Laterality Date  . ABDOMINAL HYSTERECTOMY    . APPENDECTOMY    . CERVICAL FUSION    . COLONOSCOPY N/A 10/19/2014   Procedure: COLONOSCOPY;  Surgeon: Wallace Cullens, MD;  Location: [facility] ENDOSCOPY;  Service: Gastroenterology;  Laterality: N/A;  . ORIF ANKLE FRACTURE    . SHOULDER ACROMIOPLASTY      Prior to Admission medications   Medication Sig Start Date  End Date Taking? Authorizing Provider  hydrOXYzine (VISTARIL) 25 MG capsule Take 1 capsule (25 mg total) by mouth every 4 (four) hours as needed for itching. 03/14/16   Jami L Hagler, PA-C  ibuprofen (ADVIL,MOTRIN) 200 MG tablet Take 400 mg by mouth every 6 (six) hours as needed for headache or moderate pain.    Historical Provider, MD  pravastatin (PRAVACHOL) 20 MG tablet Take 20 mg by mouth daily.    Historical Provider, MD  predniSONE (DELTASONE) 10 MG tablet Take a daily regimen of 6,5,4,3,2,1 03/14/16   Jami L Hagler, PA-C  VITAMIN D, ERGOCALCIFEROL, PO Take 1 capsule by mouth 2 (two) times daily.    Historical Provider, MD    Allergies Review of patient's allergies indicates no known allergies.  Family History  Problem Relation Age of Onset  . Breast cancer Maternal Grandmother     Social History Social History  Substance Use Topics  . Smoking status: Former Games developer  . Smokeless tobacco: Not on file  . Alcohol use Not on file     Review of Systems  Constitutional: No fever/chills ENT: No Difficulty swallowing, swelling about the lips/tongue/mouth Cardiovascular: No chest pain. Respiratory: No shortness of breath. No wheezing.  Gastrointestinal: No abdominal pain.  No nausea, vomiting.   Musculoskeletal: Negative for General myalgias.  Skin: Positive for generalized, erythematous, purulent rash. No open wounds, oozing, weeping, burning to the rash. Neurological: Negative for numbness, weakness, tingling. 10-point ROS otherwise negative.  ____________________________________________   PHYSICAL EXAM:  VITAL SIGNS: ED Triage Vitals  Enc Vitals Group  BP 03/14/16 1543 133/67     Pulse Rate 03/14/16 1543 89     Resp 03/14/16 1543 18     Temp 03/14/16 1543 98 F (36.7 C)     Temp Source 03/14/16 1543 Oral     SpO2 03/14/16 1543 97 %     Weight 03/14/16 1544 150 lb (68 kg)     Height 03/14/16 1544 5\' 3"  (1.6 m)     Head Circumference --      Peak Flow --       Pain Score 03/14/16 1545 10     Pain Loc --      Pain Edu? --      Excl. in GC? --      Constitutional: Alert and oriented. Well appearing and in no acute distress. Eyes: Conjunctivae are normal without icterus or injection Head: Atraumatic. ENT:      Nose: No congestion/rhinnorhea.      Mouth/Throat: Airway is patent. No swelling noted about the lips/tongue/throat. Neck: No stridor. Hematological/Lymphatic/Immunilogical: No cervical lymphadenopathy. Cardiovascular: Normal rate, regular rhythm. Normal S1 and S2.  Good peripheral circulation. Respiratory: Normal respiratory effort without tachypnea or retractions. Lungs CTAB with breath sounds noted in all lung fields. No wheeze, rhonchi, rales. Musculoskeletal: No lower extremity tenderness nor edema.  No joint effusions. Neurologic:  Normal speech and language. No gross focal neurologic deficits are appreciated.  Skin:  Skin is warm, dry and intact. Erythematous maculopapular rash noted about the bilateral lower extremities, bilateral distal upper extremities and the breasts and upper abdomen. No dermatomal pattern. No tenderness to palpation. No oozing, weeping nor skin sores. All rashes blanch. Psychiatric: Mood and affect are normal. Speech and behavior are normal. Patient exhibits appropriate insight and judgement.   ____________________________________________   LABS  None ____________________________________________  EKG  None ____________________________________________  RADIOLOGY  None ____________________________________________    PROCEDURES  Procedure(s) performed: None   Procedures   Medications - No data to display   ____________________________________________   INITIAL IMPRESSION / ASSESSMENT AND PLAN / ED COURSE  Pertinent labs & imaging results that were available during my care of the patient were reviewed by me and considered in my medical decision making (see chart for  details).  Clinical Course    Patient's diagnosis is consistent with Allergic reaction causing urticaria. Patient will be discharged home with prescriptions for prednisone Dosepak and Vistaril to take as directed. Patient is advised that she may continue 50 mg of Benadryl every 4-6 hours as needed for itching and if that is not helping she may then discontinue Benadryl and use Vistaril. Cool compress to the rashes and avoid hot showers or baths. Patient is to follow up with her primary care provider or [facility] if symptoms persist past this treatment course. Patient is given ED precautions to return to the ED for any worsening or new symptoms.    ____________________________________________  FINAL CLINICAL IMPRESSION(S) / ED DIAGNOSES  Final diagnoses:  Allergic reaction, initial encounter  Urticaria      NEW MEDICATIONS STARTED DURING THIS VISIT:  Discharge Medication List as of 03/14/2016  4:09 PM    START taking these medications   Details  hydrOXYzine (VISTARIL) 25 MG capsule Take 1 capsule (25 mg total) by mouth every 4 (four) hours as needed for itching., Starting Sat 03/14/2016, Print    predniSONE (DELTASONE) 10 MG tablet Take a daily regimen of 6,5,4,3,2,1, Print          '   Jami L  Tracie HarrierHagler, PA-C 03/14/16 1634    Nita Sicklearolina Veronese, MD 03/16/16 1102

## 2016-03-14 NOTE — ED Triage Notes (Signed)
Rash x 2 days, itchy, was seen at MD office yesterday.

## 2016-06-23 ENCOUNTER — Other Ambulatory Visit: Payer: Self-pay | Admitting: Internal Medicine

## 2016-06-23 DIAGNOSIS — Z1231 Encounter for screening mammogram for malignant neoplasm of breast: Secondary | ICD-10-CM

## 2016-08-03 ENCOUNTER — Ambulatory Visit
Admission: RE | Admit: 2016-08-03 | Discharge: 2016-08-03 | Disposition: A | Discharge status: Home / Self Care | Payer: 59 | Source: Ambulatory Visit | Attending: Internal Medicine | Admitting: Internal Medicine

## 2016-08-03 DIAGNOSIS — Z1231 Encounter for screening mammogram for malignant neoplasm of breast: Secondary | ICD-10-CM | POA: Diagnosis present

## 2016-08-11 ENCOUNTER — Emergency Department
Admission: EM | Admit: 2016-08-11 | Discharge: 2016-08-11 | Disposition: A | Discharge status: Home / Self Care | Payer: 59 | Attending: Emergency Medicine | Admitting: Emergency Medicine

## 2016-08-11 ENCOUNTER — Encounter: Payer: Self-pay | Admitting: Emergency Medicine

## 2016-08-11 ENCOUNTER — Emergency Department: Payer: 59

## 2016-08-11 DIAGNOSIS — M7532 Calcific tendinitis of left shoulder: Secondary | ICD-10-CM | POA: Insufficient documentation

## 2016-08-11 DIAGNOSIS — Z87891 Personal history of nicotine dependence: Secondary | ICD-10-CM | POA: Insufficient documentation

## 2016-08-11 DIAGNOSIS — M25512 Pain in left shoulder: Secondary | ICD-10-CM | POA: Diagnosis present

## 2016-08-11 MED ORDER — MELOXICAM 7.5 MG PO TABS: 7.5000 mg | ORAL_TABLET | Freq: Every day | ORAL | 2 refills | Status: DC

## 2016-08-11 MED ORDER — TRAMADOL HCL 50 MG PO TABS: 50.0000 mg | ORAL_TABLET | Freq: Four times a day (QID) | ORAL | 0 refills | Status: DC | PRN

## 2016-08-11 NOTE — ED Triage Notes (Signed)
C/O left shoulder pain since Friday. 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

## 2016-08-11 NOTE — ED Notes (Signed)
See triage note  Developed left shoulder pain several days ago. unsure of injury. 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

## 2016-08-11 NOTE — ED Provider Notes (Signed)
[facility] Emergency Department Provider Note   ____________________________________________   First MD Initiated Contact with Patient 08/11/16 [phone]     (approximate)  I have reviewed the triage vital signs and the nursing notes.   HISTORY  Chief Complaint Shoulder Pain    HPI Patricia Hahn is a 71 y.o. female patient complaining of left shoulder pain secondary to repetitive lifting 4 days ago. 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 Patient stated the pain is worsenedwith adduction and overhead reaching. Patient stated pain starts at the humeral head and radiates down her whole arm. No palliative measures taken for this complaint. She rates the pain as a 10 over 10. Patient described a pain as "achy/sharp".   History reviewed. No pertinent past medical history.  There are no active problems to display for this patient.   Past Surgical History:  Procedure Laterality Date  . ABDOMINAL HYSTERECTOMY    . APPENDECTOMY    . CERVICAL FUSION    . COLONOSCOPY N/A 10/19/2014   Procedure: COLONOSCOPY;  Surgeon: Wallace Cullens, MD;  Location: [facility] END[facility]COPY;  Service: Gastroenterology;  Laterality: N/A;  . ORIF ANKLE FRACTURE    . SHOULDER ACROMIOPLASTY      Prior to Admission medications   Medication Sig Start Date End Date Taking? Authorizing Provider  hydrOXYzine (VISTARIL) 25 MG capsule Take 1 capsule (25 mg total) by mouth every 4 (four) hours as needed for itching. 03/14/16   Jami L Hagler, PA-C  ibuprofen (ADVIL,MOTRIN) 200 MG tablet Take 400 mg by mouth every 6 (six) hours as needed for headache or moderate pain.    Historical Provider, MD  meloxicam (MOBIC) 7.5 MG tablet Take 1 tablet (7.5 mg total) by mouth daily. 08/11/16   Joni Reining, PA-C  pravastatin (PRAVACHOL) 20 MG tablet Take 20 mg by mouth daily.    Historical Provider, MD  predniSONE (DELTASONE) 10 MG tablet Take a daily regimen of 6,5,4,3,2,1 03/14/16   Jami L  Hagler, PA-C  traMADol (ULTRAM) 50 MG tablet Take 1 tablet (50 mg total) by mouth every 6 (six) hours as needed for moderate pain. 08/11/16   Joni Reining, PA-C  VITAMIN D, ERGOCALCIFEROL, PO Take 1 capsule by mouth 2 (two) times daily.    Historical Provider, MD    Allergies Patient has no known allergies.  Family History  Problem Relation Age of Onset  . Breast cancer Maternal Grandmother     Social History Social History  Substance Use Topics  . Smoking status: Former Games developer  . Smokeless tobacco: Never Used  . Alcohol use Not on file    Review of Systems Constitutional: No fever/chills Eyes: No visual changes. ENT: No sore throat. Cardiovascular: Denies chest pain. Respiratory: Denies shortness of breath. Gastrointestinal: No abdominal pain.  No nausea, no vomiting.  No diarrhea.  No constipation. Genitourinary: Negative for dysuria. Musculoskeletal: Left shoulder pain  Skin: Negative for rash. Neurological: Negative for headaches, focal weakness or numbness.    ____________________________________________   PHYSICAL EXAM:  VITAL SIGNS: ED Triage Vitals  Enc Vitals Group     BP 08/11/16 0842 135/67     Pulse Rate 08/11/16 0842 96     Resp 08/11/16 0842 16     Temp 08/11/16 0842 98 F (36.7 C)     Temp Source 08/11/16 0842 Oral     SpO2 08/11/16 0842 98 %     Weight --      Height --  Head Circumference --      Peak Flow --      Pain Score 08/11/16 0844 10     Pain Loc --      Pain Edu? --      Excl. in GC? --     Constitutional: Alert and oriented. Well appearing and in no acute distress. Eyes: Conjunctivae are normal. PERRL. EOMI. Head: Atraumatic. Nose: No congestion/rhinnorhea. Mouth/Throat: Mucous membranes are moist.  Oropharynx non-erythematous. Neck: No stridor.  No cervical spine tenderness to palpation. Hematological/Lymphatic/Immunilogical: No cervical lymphadenopathy. Cardiovascular: Normal rate, regular rhythm. Grossly normal  heart sounds.  Good peripheral circulation. Respiratory: Normal respiratory effort.  No retractions. Lungs CTAB. Gastrointestinal: Soft and nontender. No distention. No abdominal bruits. No CVA tenderness. Musculoskeletal: No obvious deformities the left shoulder. Patient is moderate guarding palpation at the left humeral head. Patient decreased range of motion with abduction overhead reaching. Neurologic:  Normal speech and language. No gross focal neurologic deficits are appreciated. No gait instability. Skin:  Skin is warm, dry and intact. No rash noted. Psychiatric: Mood and affect are normal. Speech and behavior are normal.  ____________________________________________   LABS (all labs ordered are listed, but only abnormal results are displayed)  Labs Reviewed - No data to display ____________________________________________  EKG   ____________________________________________  RADIOLOGY  X-rays consistent with a calcified bursa left humeral head. ____________________________________________   PROCEDURES  Procedure(s) performed: None  Procedures  Critical Care performed: No  ____________________________________________   INITIAL IMPRESSION / ASSESSMENT AND PLAN / ED COURSE  Pertinent labs & imaging results that were available during my care of the patient were reviewed by me and considered in my medical decision making (see chart for details).  Calcified tendinitis left shoulder. Patient given discharge care instructions. Patient given a prescription for Mobic and tramadol. Patient advised to follow orthopedics for definitive evaluation and treatment.      ____________________________________________   FINAL CLINICAL IMPRESSION(S) / ED DIAGNOSES  Final diagnoses:  Calcific tendinitis of left shoulder region      NEW MEDICATIONS STARTED DURING THIS VISIT:  New Prescriptions   MELOXICAM (MOBIC) 7.5 MG TABLET    Take 1 tablet (7.5 mg total) by mouth  daily.   TRAMADOL (ULTRAM) 50 MG TABLET    Take 1 tablet (50 mg total) by mouth every 6 (six) hours as needed for moderate pain.     Note:  This document was prepared using Dragon voice recognition software and may include unintentional dictation errors.    Joni ReiningRonald K Pranay Hilbun, PA-C 08/11/16 1020    Merrily BrittleNeil Rifenbark, MD 08/11/16 1148

## 2017-06-28 ENCOUNTER — Other Ambulatory Visit: Payer: Self-pay | Admitting: Internal Medicine

## 2017-06-28 DIAGNOSIS — Z1231 Encounter for screening mammogram for malignant neoplasm of breast: Secondary | ICD-10-CM

## 2017-08-05 ENCOUNTER — Ambulatory Visit
Admission: RE | Admit: 2017-08-05 | Discharge: 2017-08-05 | Disposition: A | Discharge status: Home / Self Care | Payer: 59 | Source: Ambulatory Visit | Attending: Internal Medicine | Admitting: Internal Medicine

## 2017-08-05 DIAGNOSIS — Z1231 Encounter for screening mammogram for malignant neoplasm of breast: Secondary | ICD-10-CM | POA: Insufficient documentation

## 2018-06-27 ENCOUNTER — Other Ambulatory Visit: Payer: Self-pay | Admitting: Internal Medicine

## 2018-06-27 DIAGNOSIS — Z1231 Encounter for screening mammogram for malignant neoplasm of breast: Secondary | ICD-10-CM

## 2018-08-11 ENCOUNTER — Ambulatory Visit
Admission: RE | Admit: 2018-08-11 | Discharge: 2018-08-11 | Disposition: A | Discharge status: Home / Self Care | Payer: Medicare HMO | Source: Ambulatory Visit | Attending: Internal Medicine | Admitting: Internal Medicine

## 2018-08-11 ENCOUNTER — Other Ambulatory Visit: Payer: Self-pay

## 2018-08-11 DIAGNOSIS — Z1231 Encounter for screening mammogram for malignant neoplasm of breast: Secondary | ICD-10-CM | POA: Diagnosis not present

## 2019-02-16 ENCOUNTER — Ambulatory Visit: Payer: Self-pay | Admitting: Podiatry

## 2019-02-23 ENCOUNTER — Ambulatory Visit: Payer: Medicare HMO | Admitting: Podiatry

## 2019-02-23 ENCOUNTER — Ambulatory Visit (INDEPENDENT_AMBULATORY_CARE_PROVIDER_SITE_OTHER): Payer: Medicare HMO

## 2019-02-23 ENCOUNTER — Encounter: Payer: Self-pay | Admitting: Podiatry

## 2019-02-23 ENCOUNTER — Other Ambulatory Visit: Payer: Self-pay

## 2019-02-23 ENCOUNTER — Other Ambulatory Visit: Payer: Self-pay | Admitting: Podiatry

## 2019-02-23 VITALS — BP 132/82 | HR 85

## 2019-02-23 DIAGNOSIS — M129 Arthropathy, unspecified: Secondary | ICD-10-CM

## 2019-02-23 DIAGNOSIS — L309 Dermatitis, unspecified: Secondary | ICD-10-CM | POA: Insufficient documentation

## 2019-02-23 DIAGNOSIS — M722 Plantar fascial fibromatosis: Secondary | ICD-10-CM

## 2019-02-23 MED ORDER — AMMONIUM LACTATE 12 % EX LOTN: 1.0000 "application " | TOPICAL_LOTION | CUTANEOUS | 2 refills | Status: AC | PRN

## 2019-02-23 NOTE — Progress Notes (Signed)
This patient presents the office with 2 specific concerns concerning both feet.  She says that years ago she had foot surgery which has led to pain on the outside of her right foot.  She also says that she developed significant callus on the back of her right heel and wears down her shoes significantly on her right heel.  She had previous surgery of the foot and now there is no real foot range of motion noted.  She also has a crusty area in the center of her left foot which she has applied lotions and moisturizers to but the problem persists.  She also states that she has frequent pain and discomfort running through the arch of her right foot greater than her left.  She presents the office today for an evaluation and treatment of her feet.  Vascular  Dorsalis pedis and posterior tibial pulses are palpable  B/L.  Capillary return  WNL.  Temperature gradient is  WNL.  Skin turgor  WNL  Sensorium  Senn Weinstein monofilament wire  WNL. Normal tactile sensation.  Nail Exam  Patient has normal nails with no evidence of bacterial or fungal infection.  Orthopedic  Exam  Muscle tone and muscle strength  WNL.    No crepitus or joint effusion noted.  Foot type is unremarkable and digits show no abnormalities.  Examination of her right foot reveals normal df/pf right ankle with no inv/ev STJ right foot.  HAV  B/L.  Skin  No open lesions.  Normal skin texture and turgor. Crusty hyperkeratotic skin  In the center of her left arch.    Chronic arthritis right rearfoot.  Plantar fasciitis  B/L.  Dermatitis arch left foot.  IE. Xrays reveal 2 Screws Crossing the subtalar joint right foot.  The bone appears to have healed at the site of subtalar joint.   Mild HAV first MPJ right foot.  Discussed this condition with this patient.  Explained to the patient she had a fusion performed on her right foot when her foot surgery was performed previously.  She says she had fallen off a ladder and this required surgery.  Told the  patient that her cramping in her arch and her pain in her foot are directly caused by the lack of motion in her rear foot.  Prescribe Lac-Hydrin for this patient to apply to her left arch area.  Also prescribed power step insoles to help to support both feet.  Return to clinic as needed.     Gardiner Barefoot DPM

## 2019-03-13 ENCOUNTER — Ambulatory Visit: Payer: Medicare HMO | Attending: Neurology | Admitting: Physical Therapy

## 2019-03-13 ENCOUNTER — Encounter: Payer: Self-pay | Admitting: Physical Therapy

## 2019-03-13 ENCOUNTER — Other Ambulatory Visit: Payer: Self-pay

## 2019-03-13 DIAGNOSIS — R2689 Other abnormalities of gait and mobility: Secondary | ICD-10-CM | POA: Diagnosis present

## 2019-03-13 DIAGNOSIS — M6281 Muscle weakness (generalized): Secondary | ICD-10-CM

## 2019-03-13 DIAGNOSIS — R2681 Unsteadiness on feet: Secondary | ICD-10-CM | POA: Diagnosis present

## 2019-03-13 NOTE — Therapy (Addendum)
[place] MAIN [facility] SERVICES [address] [place], [place], [address] Phone: [phone]   Fax:  [phone]  Physical Therapy Evaluation  Patient Details  Name: Patricia Hahn MRN: [id number] Date of Birth: 02-13-1946 Referring Provider (PT): Lamonte Sakai   Encounter Date: 03/13/2019  PT End of Session - 03/13/19 0949    Visit Number  1    Number of Visits  17    Date for PT Re-Evaluation  05/08/19    PT Start Time  0830    PT Stop Time  0930    PT Time Calculation (min)  60 min    Equipment Utilized During Treatment  Gait belt    Activity Tolerance  Patient tolerated treatment well;No increased pain    Behavior During Therapy  [facility] for tasks assessed/performed       History reviewed. No pertinent past medical history.  Past Surgical History:  Procedure Laterality Date  . ABDOMINAL HYSTERECTOMY    . APPENDECTOMY    . CERVICAL FUSION    . COLONOSCOPY N/A 10/19/2014   Procedure: COLONOSCOPY;  Surgeon: Hulen Luster, MD;  Location: [facility] ENDOSCOPY;  Service: Gastroenterology;  Laterality: N/A;  . ORIF ANKLE FRACTURE    . SHOULDER ACROMIOPLASTY      There were no vitals filed for this visit.   Subjective Assessment - 03/13/19 0837    Subjective  Feels unsteady on her feet, unable to walk fast. Recently received new custom shoe inserts that pt states improved her balance when walking.    Pertinent History  Patient is a 73 y.o. female with Hx of R ankle fracture in 2004 after falling off ladder, bilateral plantar fasciitis, shoulder acromioplasty. Is a current smoker of 2 cigarettes/day. Patient received physical therapy in 2017 for balance, which patient claimed resulted in significant improvements. She has c/o frequent LOB while standing, walking, bending over, and lifting, with 2 reported falls in the past 6 months.    Limitations  Lifting;Standing;Walking    How long can you sit comfortably?  N/A; can sit all day.    How long can  you stand comfortably?  Able to stand 4 hour shifts at work    How long can you walk comfortably?  Able to work 4 hour shift without stops, uses shopping cart for assistance.    Patient Stated Goals  improve walking and balance    Currently in Pain?  No/denies    Multiple Pain Sites  No         [facility] PT Assessment - 03/13/19 0842      Assessment   Referring Provider (PT)  Lamonte Sakai    Onset Date/Surgical Date  03/12/18    Hand Dominance  Right    Next MD Visit  11/27    Prior Therapy  Yes, 2017      Balance Screen   Has the patient fallen in the past 6 months  Yes    How many times?  2    Has the patient had a decrease in activity level because of a fear of falling?   Yes    Is the patient reluctant to leave their home because of a fear of falling?   No      Home Environment   Living Environment  Private residence    Living Arrangements  Alone    Type of [place] to enter    Entrance Stairs-Number of Steps  6    Entrance Stairs-Rails  Right;Left;Can reach both    Home Layout  One level    Home Equipment  None    Additional Comments  Uses shopping cart for balance when working at [employer].       Prior Function   Level of Independence  Independent    Vocation  Part time employment    Vocation Requirements  Walking, bending, lifting, standing long periods of time     Leisure  Computer games      [employer]   Sit to Stand  Able to stand without using hands and stabilize independently    Standing Unsupported  Able to stand safely 2 minutes    Sitting with Back Unsupported but Feet Supported on Floor or Stool  Able to sit safely and securely 2 minutes    Stand to Sit  Sits safely with minimal use of hands    Transfers  Able to transfer safely, minor use of hands    Standing Unsupported with Eyes Closed  Able to stand 10 seconds safely    Standing Unsupported with Feet Together  Able to place feet together independently and stand for 1  minute with supervision    From Standing, Reach Forward with Outstretched Arm  Can reach forward >12 cm safely (5")    From Standing Position, Pick up Object from Floor  Able to pick up shoe, needs supervision    From Standing Position, Turn to Look Behind Over each Shoulder  Looks behind from both sides and weight shifts well    Turn 360 Degrees  Able to turn 360 degrees safely in 4 seconds or less    Standing Unsupported, Alternately Place Feet on Step/Stool  Able to stand independently and complete 8 steps >20 seconds    Standing Unsupported, One Foot in Front  Able to take small step independently and hold 30 seconds    Standing on One Leg  Unable to try or needs assist to prevent fall    Total Score  46       This entire session was performed under direct supervision and direction of a licensed therapist/therapist assistant . I have personally read, edited and approve of the note as written.  PAIN: None this date.  POSTURE: WNL   PROM/AROM: BLE WNL, R ankle ROM WFL.   STRENGTH:  Graded on a 0-5 scale Muscle Group Left Right                          Hip Flex 4- 4-  Hip Abd 4 4  Hip Add 4+ 5  Hip Ext 4 3+  Hip IR/ER 5 5  Knee Flex 5 4  Knee Ext 5 5  Ankle DF 5 4-  Ankle PF 5 5   SENSATION: WNL BLE   SPECIAL TESTS: Deferred.    FUNCTIONAL MOBILITY: Patient able to perform bed mobility and sit to stand transfers independently. Requires rail assist for stair negotiation.    BALANCE: Unable to maintain tandem stance or SLS without UE support to prevent fall. R>L foot forward more difficult in semi-tandem stance with occasional UE support for safety. Loses balance frequently when reaching forward or bending over.    GAIT: No assistive device. Demonstrates minimal ankle strategies during gait for balance; slower gait speed is very unstable with frequent LOB. Fastest gait speed is more stable due to conservation of momentum, exhibits less stance time on RLE and decreased  step length with LLE.   OUTCOME MEASURES: TEST Outcome 03/13/2019 Interpretation  5 times sit<>stand 17.52 sec >60 yo, >15 sec indicates increased risk for falls  10 meter walk test        0.71 m/s normal walking speed; 1.25 m/s fastest walking speed <1.0 m/s indicates increased risk for falls; limited community ambulator  Timed up and Go        10.50   sec <14 sec indicates increased risk for falls  6 minute walk test        Deferred 1000 feet is community Financial controller 46/56 <36/56 (100% risk for falls), 37-45 (80% risk for falls); 46-51 (>50% risk for falls); 52-55 (lower risk <25% of falls)                 Objective measurements completed on examination: See above findings.         Exercises Hip Abduction with Resistance Loop - 10 reps BLE Hip Extension with Resistance Loop - 10 reps BLE Standing Romberg to 1/2 Tandem Stance x30 sec hold, each LE forward     PT Education - 03/13/19 0948    Education Details  HEP, POC    Person(s) Educated  Patient    Methods  Explanation;Demonstration;Handout    Comprehension  Verbalized understanding;Need further instruction       PT Short Term Goals - 03/13/19 0950      PT SHORT TERM GOAL #1   Title  Patient will be independent in initial home exercise program to improve strength/mobility for better functional independence with ADLs    Baseline  Provided HEP 03/13/19    Time  4    Period  Weeks    Status  New    [employer] Date  04/10/19      PT SHORT TERM GOAL #2   Title  Patient will increase normal walking speed 10 meter walk test to >1.62m/s as to improve gait speed for better community ambulation and to reduce fall risk.    Baseline  03/13/19 14.13 m/s normal walking speed with frequent LOB.    Time  4    Period  Weeks    Status  New    [place] Date  04/10/19        PT Long Term Goals - 03/13/19 0951      PT LONG TERM GOAL #1   Title  Patient will be independent in finalized home exercise  program to improve strength/mobility for better functional independence with ADLs    Time  8    Period  Weeks    Status  New    [place] Date  05/08/19      PT LONG TERM GOAL #2   Title  Patient will ascend/descend 4 stairs without rail assist independently without loss of balance to improve ability to get in/out of home.    Baseline  03/13/19 Patient states she uses 1-2 HHA to climb 6 stairs to enter/exit home.    Time  8    Period  Weeks    Status  New    [place] Date  05/08/19      PT LONG TERM GOAL #3   Title  Patient will increase Berg Balance score by > 6 points to demonstrate decreased fall risk during functional activities.    Baseline  03/13/19 46/56 BBT.    Time  8    Period  Weeks    Status  New    [place] Date  05/08/19      PT LONG TERM GOAL #4   Title  Patient will be independent in bending down towards floor and picking up small object (<5 pounds) and then stand back up without loss of balance as to improve ability to pick up and clean up room at home and perform vocational duties.    Baseline  03/13/19 LOB when bending over and lifting.    Time  8    Period  Weeks    Status  New    [place] Date  05/08/19      PT LONG TERM GOAL #5   Title  Pt will increase gross LE strength to 5/5 for improved ability to complete ADLs and IADLs.    Baseline  03/13/19 R>L weakness, ankle DF, hip ext, hip flex, hip abd from 3+ to 4+/5.    Time  8    Period  Weeks    Status  New    [place] Date  05/08/19             Plan - 03/13/19 0958    Clinical Impression Statement    Patient is a 73 y.o. female who presents to PT eval with unsteady gait and hx of a fall. She has decreased BLE hip and ankle strength, decreased dynamic standing balance, unsteady gait with poor foot placement and path deviations.Her outcome measure indicate that she has a falls risk.  She will benefit from skilled PT to improve strength, balance and safety.    Personal Factors and Comorbidities   Age;Fitness;Profession    Examination-Activity Limitations  Bend;Carry;Lift;Locomotion Level;Reach Overhead;Squat;Stairs;Stand;Transfers    Examination-Participation Restrictions  Yard Work;Shop    Stability/Clinical Decision Making  Stable/Uncomplicated    Clinical Decision Making  Low    Rehab Potential  Excellent    PT Frequency  2x / week    PT Duration  8 weeks    PT Treatment/Interventions  ADLs/Self Care Home Management;Aquatic Therapy;Cryotherapy;Electrical Stimulation;Moist Heat;Ultrasound;Gait training;Stair training;Functional mobility training;Therapeutic activities;Therapeutic exercise;Balance training;Neuromuscular re-education;Patient/family education;Orthotic Fit/Training;Manual techniques;Passive range of motion;Energy conservation    PT Next Visit Plan  Assess 6mwt, DGI, possibly FGA    PT Home Exercise Plan  see pt instructions    Consulted and Agree with Plan of Care  Patient       Patient will benefit from skilled therapeutic intervention in order to improve the following deficits and impairments:  Abnormal gait, Decreased activity tolerance, Decreased balance, Decreased endurance, Decreased mobility, Difficulty walking, Decreased strength, Improper body mechanics, Decreased range of motion  Visit Diagnosis: Unsteadiness on feet - Plan: PT plan of care cert/re-cert  Other abnormalities of gait and mobility - Plan: PT plan of care cert/re-cert  Muscle weakness (generalized) - Plan: PT plan of care cert/re-cert     Problem List Patient Active Problem List   Diagnosis Date Noted  . 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 Plantar fasciitis, bilateral 02/23/2019  . Dermatitis 02/23/2019   Fran Mcree A. Earlene Plateravis, SPT  Lois HuxleyJames Doyel Mulkern, PT DPT 03/13/2019, 4:32 PM  [facility] [address] [place], [place], [address] Phone: [phone]   Fax:  [phone]  Name: Patricia Hahn MRN: [id number] Date of Birth:  Oct 07, 1945

## 2019-03-13 NOTE — Patient Instructions (Signed)
HEP:  Exercises Hip Abduction with Resistance Loop - 10 reps - 2 sets - 2x daily - 7x weekly Hip Extension with Resistance Loop - 10 reps - 2 sets - 2x daily - 7x weekly Standing Romberg to 1/2 Tandem Stance - 1 reps - 2 sets - 30 hold - 2x daily - 7x weekly

## 2019-03-15 ENCOUNTER — Ambulatory Visit: Payer: Medicare HMO | Admitting: Physical Therapy

## 2019-03-15 ENCOUNTER — Encounter: Payer: Self-pay | Admitting: Physical Therapy

## 2019-03-15 ENCOUNTER — Other Ambulatory Visit: Payer: Self-pay

## 2019-03-15 DIAGNOSIS — M6281 Muscle weakness (generalized): Secondary | ICD-10-CM

## 2019-03-15 DIAGNOSIS — R2681 Unsteadiness on feet: Secondary | ICD-10-CM

## 2019-03-15 DIAGNOSIS — R2689 Other abnormalities of gait and mobility: Secondary | ICD-10-CM

## 2019-03-15 NOTE — Therapy (Addendum)
[place] MAIN [facility] SERVICES [address] [place], [place], [address] Phone: [phone]   Fax:  [phone]  Physical Therapy Treatment  Patient Details  Name: Patricia Hahn MRN: [id number] Date of Birth: Oct 21, 1945 Referring Provider (PT): Lamonte Sakai   Encounter Date: 03/15/2019  PT End of Session - 03/15/19 1157    Visit Number  2    Number of Visits  17    Date for PT Re-Evaluation  05/08/19    PT Start Time  9983    PT Stop Time  1230    PT Time Calculation (min)  43 min    Equipment Utilized During Treatment  Gait belt    Activity Tolerance  Patient tolerated treatment well;No increased pain    Behavior During Therapy  [facility] for tasks assessed/performed       History reviewed. No pertinent past medical history.  Past Surgical History:  Procedure Laterality Date  . ABDOMINAL HYSTERECTOMY    . APPENDECTOMY    . CERVICAL FUSION    . COLONOSCOPY N/A 10/19/2014   Procedure: COLONOSCOPY;  Surgeon: Hulen Luster, MD;  Location: [facility] ENDOSCOPY;  Service: Gastroenterology;  Laterality: N/A;  . ORIF ANKLE FRACTURE    . SHOULDER ACROMIOPLASTY      There were no vitals filed for this visit.  Subjective Assessment - 03/15/19 1150    Subjective  Patient feels tired today following work today, otherwise is doing fine. No new falls or new concerns.    Pertinent History  Patient is a 73 y.o. female with Hx of R ankle fracture in 2004 after falling off ladder, bilateral plantar fasciitis, shoulder acromioplasty. Is a current smoker of 2 cigarettes/day. Patient received physical therapy in 2017 for balance, which patient claimed resulted in significant improvements. She has c/o frequent LOB while standing, walking, bending over, and lifting, with 2 reported falls in the past 6 months.    Limitations  Lifting;Standing;Walking    How long can you sit comfortably?  N/A; can sit all day.    How long can you stand comfortably?  Able to stand 4  hour shifts at work    How long can you walk comfortably?  Able to work 4 hour shift without stops, uses shopping cart for assistance.    Patient Stated Goals  improve walking and balance       This entire session was performed under direct supervision and direction of a licensed therapist/therapist assistant . I have personally read, edited and approve of the note as written. Treatment: Therex:  -Leg press 75# 1x20 reps, 80# 1x20 reps; demonstrates good eccentric control  -Calf raises on leg press 30# 2x20 reps  -Standing hip abd and ext x15 BLE with 2# ankle weight, UE support  NMRE:  -Standing tandem stance 2x30 sec hold each LE forward  -Standing marches x20 to challenge SLS time; cues to lift knees higher; patient able to perform slowly, but more difficulty with SLS on RLE with occasional rail support for stability.  -Alternating toe taps to 6" step x20 from firm surface  -Standing on firm surface with one foot on 6" step hold 2x30 sec with vertical head nods, BLE  -Alternating step-ups x20 unsupported from firm surface; occasional rail assist   -Stand feet together on airex foam hold 1x30 sec, followed by semi-tandem hold x30 sec each LE forward  -Alternating toe taps to 6" step x20 from airex foam  -Alternating step-ups to 6" step from airex foam; frequent  rail assist for stability, postural deviation and LOB to R side  Pt educated throughout session about proper posture and technique with exercises. Improved exercise technique, movement at target joints, use of target muscles after min to mod verbal, visual, tactile cues.    Patient demonstrates excellent motivation throughout today's session. Patient was able to perform LE strengthening exercises with minimal fatigue, required min VC for sequencing and technique during standing hip extension exercises. Patient was challenged by SLS time on RLE>LLE, namely with step negotiation, and balance interventions involving  compliant surfaces, which improved with repetition and min VC to go slowly.    PT Education - 03/15/19 1159    Education Details  HEP, strengthening, balance    Person(s) Educated  Patient    Methods  Explanation;Demonstration       PT Short Term Goals - 03/13/19 0950      PT SHORT TERM GOAL #1   Title  Patient will be independent in initial home exercise program to improve strength/mobility for better functional independence with ADLs    Baseline  Provided HEP 03/13/19    Time  4    Period  Weeks    Status  New    Target Date  04/10/19      PT SHORT TERM GOAL #2   Title  Patient will increase normal walking speed 10 meter walk test to >1.19m/s as to improve gait speed for better community ambulation and to reduce fall risk.    Baseline  03/13/19 14.13 m/s normal walking speed with frequent LOB.    Time  4    Period  Weeks    Status  New    Target Date  04/10/19        PT Long Term Goals - 03/13/19 0951      PT LONG TERM GOAL #1   Title  Patient will be independent in finalized home exercise program to improve strength/mobility for better functional independence with ADLs    Time  8    Period  Weeks    Status  New    Target Date  05/08/19      PT LONG TERM GOAL #2   Title  Patient will ascend/descend 4 stairs without rail assist independently without loss of balance to improve ability to get in/out of home.    Baseline  03/13/19 Patient states she uses 1-2 HHA to climb 6 stairs to enter/exit home.    Time  8    Period  Weeks    Status  New    Target Date  05/08/19      PT LONG TERM GOAL #3   Title  Patient will increase Berg Balance score by > 6 points to demonstrate decreased fall risk during functional activities.    Baseline  03/13/19 46/56 BBT.    Time  8    Period  Weeks    Status  New    Target Date  05/08/19      PT LONG TERM GOAL #4   Title  Patient will be independent in bending down towards floor and picking up small object (<5 pounds) and then stand  back up without loss of balance as to improve ability to pick up and clean up room at home and perform vocational duties.    Baseline  03/13/19 LOB when bending over and lifting.    Time  8    Period  Weeks    Status  New    Target Date  05/08/19  PT LONG TERM GOAL #5   Title  Pt will increase gross LE strength to 5/5 for improved ability to complete ADLs and IADLs.    Baseline  03/13/19 R>L weakness, ankle DF, hip ext, hip flex, hip abd from 3+ to 4+/5.    Time  8    Period  Weeks    Status  New    Target Date  05/08/19            Plan - 03/15/19 1238    Clinical Impression Statement  Patient demonstrates excellent motivation throughout today's session. Patient was able to perform LE strengthening exercises with minimal fatigue, required min VC for sequencing and technique during standing hip extension exercises. Patient was challenged by SLS time on RLE>LLE, namely with step negotiation, and balance interventions involving compliant surfaces, which improved with repetition and min VC to go slowly. Patient would continue to benefit from skilled PT to address the deficits outlined in this note.    Personal Factors and Comorbidities  Age;Fitness;Profession    Examination-Activity Limitations  Bend;Carry;Lift;Locomotion Level;Reach Overhead;Squat;Stairs;Stand;Transfers    Examination-Participation Restrictions  Yard Work;Shop    Stability/Clinical Decision Making  Stable/Uncomplicated    Rehab Potential  Excellent    PT Frequency  2x / week    PT Duration  8 weeks    PT Treatment/Interventions  ADLs/Self Care Home Management;Aquatic Therapy;Cryotherapy;Electrical Stimulation;Moist Heat;Ultrasound;Gait training;Stair training;Functional mobility training;Therapeutic activities;Therapeutic exercise;Balance training;Neuromuscular re-education;Patient/family education;Orthotic Fit/Training;Manual techniques;Passive range of motion;Energy conservation    PT Next Visit Plan  Assess 6mwt,  implement more balance intervention with compliant surfaces & dynamic balance    Consulted and Agree with Plan of Care  Patient       Patient will benefit from skilled therapeutic intervention in order to improve the following deficits and impairments:  Abnormal gait, Decreased activity tolerance, Decreased balance, Decreased endurance, Decreased mobility, Difficulty walking, Decreased strength, Improper body mechanics, Decreased range of motion  Visit Diagnosis: Unsteadiness on feet  Other abnormalities of gait and mobility  Muscle weakness (generalized)     Problem List Patient Active Problem List   Diagnosis Date Noted  . 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 Plantar fasciitis, bilateral 02/23/2019  . Dermatitis 02/23/2019   Yasin Ducat A. [address]Orvis Stann, SPT  [place], [place] AF[place]ristine S, [place] DPT 03/15/2019, 2:17 PM  [place] [facility] [address] [place], [place], [address] Phone: [phone]   Fax:  [phone]  Name: Patricia Hahn MRN: [id number] Date of Birth: 02-28-1946

## 2019-03-20 ENCOUNTER — Ambulatory Visit: Payer: Medicare HMO | Admitting: Physical Therapy

## 2019-03-20 ENCOUNTER — Encounter: Payer: Self-pay | Admitting: Physical Therapy

## 2019-03-20 ENCOUNTER — Other Ambulatory Visit: Payer: Self-pay

## 2019-03-20 DIAGNOSIS — R2689 Other abnormalities of gait and mobility: Secondary | ICD-10-CM

## 2019-03-20 DIAGNOSIS — R2681 Unsteadiness on feet: Secondary | ICD-10-CM

## 2019-03-20 DIAGNOSIS — M6281 Muscle weakness (generalized): Secondary | ICD-10-CM

## 2019-03-20 NOTE — Therapy (Addendum)
[place] [facility] [address] [place], [place], [address] Phone: [phone]   Fax:  [phone]  Physical Therapy Treatment  Patient Details  Name: Patricia MoodLee Ann Hahn MRN: [id number] Date of Birth: January 23, 1946 Referring Provider (PT): Yves DillNeelam Khan   Encounter Date: 03/20/2019  PT End of Session - 03/20/19 0831    Visit Number  3    Number of Visits  17    Date for PT Re-Evaluation  05/08/19    PT Start Time  0825    PT Stop Time  0905    PT Time Calculation (min)  40 min    Equipment Utilized During Treatment  Gait belt    Activity Tolerance  Patient tolerated treatment well;No increased pain    Behavior During Therapy  [facility] for tasks assessed/performed       History reviewed. No pertinent past medical history.  Past Surgical History:  Procedure Laterality Date  . ABDOMINAL HYSTERECTOMY    . APPENDECTOMY    . CERVICAL FUSION    . COLONOSCOPY N/A 10/19/2014   Procedure: COLONOSCOPY;  Surgeon: Wallace CullensPaul Y Oh, MD;  [facility];  Service: Gastroenterology;  Laterality: N/A;  . ORIF ANKLE FRACTURE    . SHOULDER ACROMIOPLASTY      There were no vitals filed for this visit.  Subjective Assessment - 03/20/19 0826    Subjective  Patient is doing well today, no new falls or any pain today.    Pertinent History  Patient is a 73 y.o. female with Hx of R ankle fracture in 2004 after falling off ladder, bilateral plantar fasciitis, shoulder acromioplasty. Is a current smoker of 2 cigarettes/day. Patient received physical therapy in 2017 for balance, which patient claimed resulted in significant improvements. She has c/o frequent LOB while standing, walking, bending over, and lifting, with 2 reported falls in the past 6 months.    Limitations  Lifting;Standing;Walking    How long can you sit comfortably?  N/A; can sit all day.    How long can you stand comfortably?  Able to stand 4 hour shifts at work    How long can you walk  comfortably?  Able to work 4 hour shift without stops, uses shopping cart for assistance.    Patient Stated Goals  improve walking and balance    Currently in Pain?  No/denies    Multiple Pain Sites  No       This entire session was performed under direct supervision and direction of a licensed therapist/therapist assistant . I have personally read, edited and approve of the note as written. Neuro Re-ed: Patient requires CGA during all standing interventions due to limited stability and patient fear of LOB. Cueing for reduction of UE support required as well as postural alignment for optimal stability within COM   TREATMENT: Therex:  -Leg press 90# 2x20 reps; demonstrates good eccentric control   -Calf raises on leg press 45# 2x20 reps   With 2# ankle weights:  -Standing marches x20  -Standing hip abd 2x10  -Standing hip ext 2x10   NMRE:   -Standing marches from airex foam x20 to challenge SLS time; cues to lift knees higher; x3 HHA  -Alternating toe taps to 6" step x20 from airex foam  -Alternating step-ups to 6" step x20 unsupported from airex foam; more difficult RLE first  -Lunge to bosu ball x10 BLE; cues to hold stance for 3 sec  -Hold lunge to bosu ball with head turns x10 each  LE forward; occasional UE support  -Lunge to bosu ball with trunk rotation x10 each LE forward; more difficult with RLE forward, occasional UE support  -Matrix walk outs 7.5# x3 laps fwd/bwd, 12.5# x2 laps; cues for posture and stepping strategies, occasional LOB when in SLS on RLE  Pt educated throughout session about proper posture and technique with exercises. Improved exercise technique, movement at target joints, use of target muscles after min to mod verbal, visual, tactile cues.   Patient demonstrates excellent motivation throughout today's session. Patient was able to perform progressions to LE strengthening exercises with minimal fatigue and advanced balance interventions, required  occasional UE support to combat LOB and min VC and demonstration for exercise technique. Patient continues to be challenged by balance on compliant surfaces and SLS time, namely on RLE, which improved with min VC and repetition.     PT Education - 03/20/19 0831    Education Details  HEP, strengthening, balance    Person(s) Educated  Patient    Methods  Explanation;Demonstration    Comprehension  Verbalized understanding;Need further instruction       PT Short Term Goals - 03/13/19 0950      PT SHORT TERM GOAL #1   Title  Patient will be independent in initial home exercise program to improve strength/mobility for better functional independence with ADLs    Baseline  Provided HEP 03/13/19    Time  4    Period  Weeks    Status  New    Target Date  04/10/19      PT SHORT TERM GOAL #2   Title  Patient will increase normal walking speed 10 meter walk test to >1.25m/s as to improve gait speed for better community ambulation and to reduce fall risk.    Baseline  03/13/19 14.13 m/s normal walking speed with frequent LOB.    Time  4    Period  Weeks    Status  New    Target Date  04/10/19        PT Long Term Goals - 03/13/19 0951      PT LONG TERM GOAL #1   Title  Patient will be independent in finalized home exercise program to improve strength/mobility for better functional independence with ADLs    Time  8    Period  Weeks    Status  New    Target Date  05/08/19      PT LONG TERM GOAL #2   Title  Patient will ascend/descend 4 stairs without rail assist independently without loss of balance to improve ability to get in/out of home.    Baseline  03/13/19 Patient states she uses 1-2 HHA to climb 6 stairs to enter/exit home.    Time  8    Period  Weeks    Status  New    Target Date  05/08/19      PT LONG TERM GOAL #3   Title  Patient will increase Berg Balance score by > 6 points to demonstrate decreased fall risk during functional activities.    Baseline  03/13/19 46/56  BBT.    Time  8    Period  Weeks    Status  New    Target Date  05/08/19      PT LONG TERM GOAL #4   Title  Patient will be independent in bending down towards floor and picking up small object (<5 pounds) and then stand back up without loss of balance as to improve  ability to pick up and clean up room at home and perform vocational duties.    Baseline  03/13/19 LOB when bending over and lifting.    Time  8    Period  Weeks    Status  New    Target Date  05/08/19      PT LONG TERM GOAL #5   Title  Pt will increase gross LE strength to 5/5 for improved ability to complete ADLs and IADLs.    Baseline  03/13/19 R>L weakness, ankle DF, hip ext, hip flex, hip abd from 3+ to 4+/5.    Time  8    Period  Weeks    Status  New    Target Date  05/08/19            Plan - 03/20/19 0914    Clinical Impression Statement  Patient demonstrates excellent motivation throughout today's session. Patient was able to perform progressions to LE strengthening exercises with minimal fatigue and advanced balance interventions, required occasional UE support to combat LOB and min VC and demonstration for exercise technique. Patient continues to be challenged by balance on compliant surfaces and SLS time, namely on RLE, which improved with min VC and repetition. Patient would continue to benefit from skilled PT to address the deficits outlined in this note.    Personal Factors and Comorbidities  Age;Fitness;Profession    Examination-Activity Limitations  Bend;Carry;Lift;Locomotion Level;Reach Overhead;Squat;Stairs;Stand;Transfers    Examination-Participation Restrictions  Yard Work;Shop    Stability/Clinical Decision Making  Stable/Uncomplicated    Rehab Potential  Excellent    PT Frequency  2x / week    PT Duration  8 weeks    PT Treatment/Interventions  ADLs/Self Care Home Management;Aquatic Therapy;Cryotherapy;Electrical Stimulation;Moist Heat;Ultrasound;Gait training;Stair training;Functional mobility  training;Therapeutic activities;Therapeutic exercise;Balance training;Neuromuscular re-education;Patient/family education;Orthotic Fit/Training;Manual techniques;Passive range of motion;Energy conservation    PT Next Visit Plan  progress balance intervention with compliant surfaces and dynamic balance    Consulted and Agree with Plan of Care  Patient       Patient will benefit from skilled therapeutic intervention in order to improve the following deficits and impairments:  Abnormal gait, Decreased activity tolerance, Decreased balance, Decreased endurance, Decreased mobility, Difficulty walking, Decreased strength, Improper body mechanics, Decreased range of motion  Visit Diagnosis: Unsteadiness on feet  Other abnormalities of gait and mobility  Muscle weakness (generalized)     Problem List Patient Active Problem List   Diagnosis Date Noted  . 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 Plantar fasciitis, bilateral 02/23/2019  . Dermatitis 02/23/2019   Amere Bricco A. [address] SPT  [place], [place], [place] DPT 03/20/2019, 3:09 PM  [place] [facility] MAIN [facility] SERVICES [address] [place], [place], [address] Phone: [phone]   Fax:  [phone]  Name: Breya Cass MRN: [id number] Date of Birth: August 06, 1945

## 2019-03-22 ENCOUNTER — Ambulatory Visit: Payer: Medicare HMO | Admitting: Physical Therapy

## 2019-03-22 ENCOUNTER — Other Ambulatory Visit: Payer: Self-pay

## 2019-03-22 ENCOUNTER — Encounter: Payer: Self-pay | Admitting: Physical Therapy

## 2019-03-22 DIAGNOSIS — R2681 Unsteadiness on feet: Secondary | ICD-10-CM

## 2019-03-22 DIAGNOSIS — R2689 Other abnormalities of gait and mobility: Secondary | ICD-10-CM

## 2019-03-22 DIAGNOSIS — M6281 Muscle weakness (generalized): Secondary | ICD-10-CM

## 2019-03-22 NOTE — Therapy (Signed)
[facility] [address] [place], [place], [address] Phone: [phone]   Fax:  [phone]  Physical Therapy Treatment  Patient Details  Name: Patricia Hahn MRN: [id number] Date of Birth: [birth year] Referring Provider (PT): Yves DillNeelam Khan   Encounter Date: 03/22/2019  PT End of Session - 03/22/19 0836    Visit Number  4    Number of Visits  17    Date for PT Re-Evaluation  05/08/19    PT Start Time  0842    PT Stop Time  0927    PT Time Calculation (min)  45 min    Equipment Utilized During Treatment  Gait belt    Activity Tolerance  Patient tolerated treatment well;No increased pain    Behavior During Therapy  [facility] for tasks assessed/performed       History reviewed. No pertinent past medical history.  Past Surgical History:  Procedure Laterality Date  . ABDOMINAL HYSTERECTOMY    . APPENDECTOMY    . CERVICAL FUSION    . COLONOSCOPY N/A 10/19/2014   Procedure: COLONOSCOPY;  Surgeon: Wallace CullensPaul Y Oh, MD;  [facility];  Service: Gastroenterology;  Laterality: N/A;  . ORIF ANKLE FRACTURE    . SHOULDER ACROMIOPLASTY      There were no vitals filed for this visit.  Subjective Assessment - 03/22/19 0843    Subjective  Patient states she is doing well today, no new falls or any concerns this date.    Pertinent History  Patient is a 73 y.o. female with Hx of R ankle fracture in 2004 after falling off ladder, bilateral plantar fasciitis, shoulder acromioplasty. Is a current smoker of 2 cigarettes/day. Patient received physical therapy in 2017 for balance, which patient claimed resulted in significant improvements. She has c/o frequent LOB while standing, walking, bending over, and lifting, with 2 reported falls in the past 6 months.    Limitations  Lifting;Standing;Walking    How long can you sit comfortably?  N/A; can sit all day.    How long can you stand comfortably?  Able to stand 4 hour shifts at work    How long can you walk comfortably?  Able to work 4 hour shift without stops, uses shopping cart for assistance.    Patient Stated Goals  improve walking and balance    Currently in Pain?  No/denies    Multiple Pain Sites  No      Neuro Re-ed: Patient requires CGA during all standing interventions due to limited stability and patient fear of LOB. Cueing for reduction of UE support required as well as postural alignment for optimal stability within COM  This entire session was performed under direct supervision and direction of a licensed therapist/therapist assistant . I have personally read, edited and approve of the note as written.  TREATMENT: Therex:   -Leg press 105# 2x20 reps; demonstrates good eccentric control   -Calf raises on leg press 60# 2x20 reps   With 2.5# ankle weights:   -Standing marches x20   -Standing hip abd 2x10   -Standing hip ext 2x10   NMRE:    -Alternating toe taps to 6" step x20 from airex foam   -Alternating step-ups to 6" step x20 unsupported from airex foam; more difficult RLE first  -Lateral toe taps from airex foam to 6" step x10, BLE;  -Lateral step-ups from airex foam to 6" step x10, BLE;    -Lunge to bosu ball with head turns x10 each LE  forward; occasional UE support; postural sway toward direction of head turn resulting in LOB.   -Matrix walk outs 7.5# x3 laps side/side; cues for posture and stepping strategies, occasional LOB when in SLS on RLE   Pt educated throughout session about proper posture and technique with exercises. Improved exercise technique, movement at target joints, use of target muscles after min to mod verbal, visual, tactile cues.    Patient demonstrates excellent motivation throughout today's session. Patient was able to perform progressions to balance interventions with a focus on stair negotiation, required min VC to go slowly and achieve balance before attempting dynamic aspects of interventions. Patient continues to  be challenged by static and dynamic balance specifically on RLE in SLS and on compliant surface, which improved with repetition, demonstration, and occasional UE support. Patient would continue to benefit from skilled PT to address the deficits outlined in this note.      PT Education - 03/22/19 0835    Education Details  HEP, strengthening, balance    Person(s) Educated  Patient    Methods  Explanation;Demonstration    Comprehension  Verbalized understanding;Need further instruction       PT Short Term Goals - 03/13/19 0950      PT SHORT TERM GOAL #1   Title  Patient will be independent in initial home exercise program to improve strength/mobility for better functional independence with ADLs    Baseline  Provided HEP 03/13/19    Time  4    Period  Weeks    Status  New    Target Date  04/10/19      PT SHORT TERM GOAL #2   Title  Patient will increase normal walking speed 10 meter walk test to >1.62m/s as to improve gait speed for better community ambulation and to reduce fall risk.    Baseline  03/13/19 14.13 m/s normal walking speed with frequent LOB.    Time  4    Period  Weeks    Status  New    Target Date  04/10/19        PT Long Term Goals - 03/13/19 0951      PT LONG TERM GOAL #1   Title  Patient will be independent in finalized home exercise program to improve strength/mobility for better functional independence with ADLs    Time  8    Period  Weeks    Status  New    Target Date  05/08/19      PT LONG TERM GOAL #2   Title  Patient will ascend/descend 4 stairs without rail assist independently without loss of balance to improve ability to get in/out of home.    Baseline  03/13/19 Patient states she uses 1-2 HHA to climb 6 stairs to enter/exit home.    Time  8    Period  Weeks    Status  New    Target Date  05/08/19      PT LONG TERM GOAL #3   Title  Patient will increase Berg Balance score by > 6 points to demonstrate decreased fall risk during functional  activities.    Baseline  03/13/19 46/56 BBT.    Time  8    Period  Weeks    Status  New    Target Date  05/08/19      PT LONG TERM GOAL #4   Title  Patient will be independent in bending down towards floor and picking up small object (<5 pounds) and then stand  back up without loss of balance as to improve ability to pick up and clean up room at home and perform vocational duties.    Baseline  03/13/19 LOB when bending over and lifting.    Time  8    Period  Weeks    Status  New    Target Date  05/08/19      PT LONG TERM GOAL #5   Title  Pt will increase gross LE strength to 5/5 for improved ability to complete ADLs and IADLs.    Baseline  03/13/19 R>L weakness, ankle DF, hip ext, hip flex, hip abd from 3+ to 4+/5.    Time  8    Period  Weeks    Status  New    Target Date  05/08/19            Plan - 03/22/19 1323    Clinical Impression Statement  Patient demonstrates excellent motivation throughout today's session. Patient was able to perform progressions to balance interventions with a focus on stair negotiation, required min VC to go slowly and achieve balance before attempting dynamic aspects of interventions. Patient continues to be challenged by static and dynamic balance specifically on RLE in SLS and on compliant surface, which improved with repetition, demonstration, and occasional UE support. Patient would continue to benefit from skilled PT to address the deficits outlined in this note.    Personal Factors and Comorbidities  Age;Fitness;Profession    Examination-Activity Limitations  Bend;Carry;Lift;Locomotion Level;Reach Overhead;Squat;Stairs;Stand;Transfers    Examination-Participation Restrictions  Yard Work;Shop    Stability/Clinical Decision Making  Stable/Uncomplicated    Rehab Potential  Excellent    PT Frequency  2x / week    PT Duration  8 weeks    PT Treatment/Interventions  ADLs/Self Care Home Management;Aquatic Therapy;Cryotherapy;Electrical  Stimulation;Moist Heat;Ultrasound;Gait training;Stair training;Functional mobility training;Therapeutic activities;Therapeutic exercise;Balance training;Neuromuscular re-education;Patient/family education;Orthotic Fit/Training;Manual techniques;Passive range of motion;Energy conservation    PT Next Visit Plan  progress balance intervention with compliant surfaces and dynamic balance    Consulted and Agree with Plan of Care  Patient       Patient will benefit from skilled therapeutic intervention in order to improve the following deficits and impairments:  Abnormal gait, Decreased activity tolerance, Decreased balance, Decreased endurance, Decreased mobility, Difficulty walking, Decreased strength, Improper body mechanics, Decreased range of motion  Visit Diagnosis: Unsteadiness on feet  Other abnormalities of gait and mobility  Muscle weakness (generalized)     Problem List Patient Active Problem List   Diagnosis Date Noted  . 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 Plantar fasciitis, bilateral 02/23/2019  . Dermatitis 02/23/2019   Yassir Enis A. [address] SPT  [place], [place], [place] DPT 03/23/2019, 2:05 PM  [facility] MAIN [facility] SERVICES [address] [place], [place], [address] Phone: [phone]   Fax:  [phone]  Name: Patricia Hahn MRN: [id number] Date of Birth: Dec 04, 1945

## 2019-03-27 ENCOUNTER — Ambulatory Visit: Payer: Medicare HMO | Admitting: Physical Therapy

## 2019-03-27 ENCOUNTER — Other Ambulatory Visit: Payer: Self-pay

## 2019-03-27 ENCOUNTER — Encounter: Payer: Self-pay | Admitting: Physical Therapy

## 2019-03-27 DIAGNOSIS — R2681 Unsteadiness on feet: Secondary | ICD-10-CM | POA: Diagnosis not present

## 2019-03-27 DIAGNOSIS — M6281 Muscle weakness (generalized): Secondary | ICD-10-CM

## 2019-03-27 DIAGNOSIS — R2689 Other abnormalities of gait and mobility: Secondary | ICD-10-CM

## 2019-03-27 NOTE — Therapy (Signed)
[facility] MAIN [facility] SERVICES [address] [place], [place], [address] Phone: [phone]   Fax:  [phone]  Physical Therapy Treatment  Patient Details  Name: Patricia Hahn MRN: [id number] Date of Birth: [birth year] Referring Provider (PT): Yves Dill   Encounter Date: 03/27/2019  PT End of Session - 03/27/19 0808    Visit Number  5    Number of Visits  17    Date for PT Re-Evaluation  05/08/19    PT Start Time  0813    PT Stop Time  0900    PT Time Calculation (min)  47 min    Equipment Utilized During Treatment  Gait belt    Activity Tolerance  Patient tolerated treatment well;No increased pain    Behavior During Therapy  [facility] for tasks assessed/performed       History reviewed. No pertinent past medical history.  Past Surgical History:  Procedure Laterality Date  . ABDOMINAL HYSTERECTOMY    . APPENDECTOMY    . CERVICAL FUSION    . COLONOSCOPY N/A 10/19/2014   Procedure: COLONOSCOPY;  Surgeon: Wallace Cullens, MD;  Location: [facility] ENDOSCOPY;  Service: Gastroenterology;  Laterality: N/A;  . ORIF ANKLE FRACTURE    . SHOULDER ACROMIOPLASTY      There were no vitals filed for this visit.  Subjective Assessment - 03/27/19 0812    Subjective  Patient reports she is doing well today, no recent falls or LOB recently. Claims she has been comoliant with HEP.    Pertinent History  Patient is a 73 y.o. female with Hx of R ankle fracture in 2004 after falling off ladder, bilateral plantar fasciitis, shoulder acromioplasty. Is a current smoker of 2 cigarettes/day. Patient received physical therapy in 2017 for balance, which patient claimed resulted in significant improvements. She has c/o frequent LOB while standing, walking, bending over, and lifting, with 2 reported falls in the past 6 months.    Limitations  Lifting;Standing;Walking    How long can you sit comfortably?  N/A; can sit all day.    How long can you stand comfortably?  Able to  stand 4 hour shifts at work    How long can you walk comfortably?  Able to work 4 hour shift without stops, uses shopping cart for assistance.    Patient Stated Goals  improve walking and balance    Currently in Pain?  No/denies    Multiple Pain Sites  No     This entire session was performed under direct supervision and direction of a licensed therapist/therapist assistant . I have personally read, edited and approve of the note as written.  Neuro Re-ed: Patient requires CGA during all standing interventions due to limited stability and patient fear of LOB. Cueing for reduction of UE support required as well as postural alignment for optimal stability within COM   Treatment:  Therex:  -Leg press 120# 2x20 reps; demonstrates good eccentric control  -Single leg calf raises with UE support x20 BLE  In // bars:  -Marching length of // bars with 3# ankle weights x3 laps; cues to lift feet high  -Side-stepping length of // bars with 3# ankle weights x3 laps  -Standing hip ext x20 BLE  NMRE:  -Tandem stance with head turns side/side x10 each LE forward, followed by x5 reps on airex foam each  -Tandem stance with self-ball toss x15 each LE forward; endorses ankle and hip strategies, no UE support needed  -Alternating step-ups to 6" step  x20 unsupported from airex foam; more difficult RLE first  -Lateral step up-and-over 6" step x10 each direction  -Lunge to bosu ball with trunk rotation side/side x10 each LE forward  -Matrix walk outs 12.5# x2 laps side/side; cues for posture and stepping strategies with weigh shift away from cable tower    Pt educated throughout session about proper posture and technique with exercises. Improved exercise technique, movement at target joints, use of target muscles after min to mod verbal, visual, tactile cues.   Patient demonstrates excellent motivation throughout today's session. Patient was able to perform qualitative aspects of progressed LE  strengthening exercises, with min VC for technique. Patient continues to be challenged by balancing on a narrow BOS with compliant surfaces, which improved with repetition, demonstration and min VC for technique. Patient would continue to benefit from skilled PT to address the deficits outlined in this note.     PT Education - 03/27/19 0808    Education Details  HEP, strengthening, balance    Person(s) Educated  Patient    Methods  Explanation;Demonstration    Comprehension  Verbalized understanding;Need further instruction       PT Short Term Goals - 03/13/19 0950      PT SHORT TERM GOAL #1   Title  Patient will be independent in initial home exercise program to improve strength/mobility for better functional independence with ADLs    Baseline  Provided HEP 03/13/19    Time  4    Period  Weeks    Status  New    Target Date  04/10/19      PT SHORT TERM GOAL #2   Title  Patient will increase normal walking speed 10 meter walk test to >1.51m/s as to improve gait speed for better community ambulation and to reduce fall risk.    Baseline  03/13/19 14.13 m/s normal walking speed with frequent LOB.    Time  4    Period  Weeks    Status  New    Target Date  04/10/19        PT Long Term Goals - 03/13/19 0951      PT LONG TERM GOAL #1   Title  Patient will be independent in finalized home exercise program to improve strength/mobility for better functional independence with ADLs    Time  8    Period  Weeks    Status  New    Target Date  05/08/19      PT LONG TERM GOAL #2   Title  Patient will ascend/descend 4 stairs without rail assist independently without loss of balance to improve ability to get in/out of home.    Baseline  03/13/19 Patient states she uses 1-2 HHA to climb 6 stairs to enter/exit home.    Time  8    Period  Weeks    Status  New    Target Date  05/08/19      PT LONG TERM GOAL #3   Title  Patient will increase Berg Balance score by > 6 points to demonstrate  decreased fall risk during functional activities.    Baseline  03/13/19 46/56 BBT.    Time  8    Period  Weeks    Status  New    Target Date  05/08/19      PT LONG TERM GOAL #4   Title  Patient will be independent in bending down towards floor and picking up small object (<5 pounds) and then stand back up  without loss of balance as to improve ability to pick up and clean up room at home and perform vocational duties.    Baseline  03/13/19 LOB when bending over and lifting.    Time  8    Period  Weeks    Status  New    Target Date  05/08/19      PT LONG TERM GOAL #5   Title  Pt will increase gross LE strength to 5/5 for improved ability to complete ADLs and IADLs.    Baseline  03/13/19 R>L weakness, ankle DF, hip ext, hip flex, hip abd from 3+ to 4+/5.    Time  8    Period  Weeks    Status  New    Target Date  05/08/19            Plan - 03/27/19 0908    Clinical Impression Statement  Patient demonstrates excellent motivation throughout today's session. Patient was able to perform qualitative aspects of progressed LE strengthening exercises, with min VC for technique. Patient continues to be challenged by balancing on a narrow BOS with compliant surfaces, which improved with repetition, demonstration and min VC for technique. Patient would continue to benefit from skilled PT to address the deficits outlined in this note.    Personal Factors and Comorbidities  Age;Fitness;Profession    Examination-Activity Limitations  Bend;Carry;Lift;Locomotion Level;Reach Overhead;Squat;Stairs;Stand;Transfers    Examination-Participation Restrictions  Yard Work;Shop    Stability/Clinical Decision Making  Stable/Uncomplicated    Rehab Potential  Excellent    PT Frequency  2x / week    PT Duration  8 weeks    PT Treatment/Interventions  ADLs/Self Care Home Management;Aquatic Therapy;Cryotherapy;Electrical Stimulation;Moist Heat;Ultrasound;Gait training;Stair training;Functional mobility  training;Therapeutic activities;Therapeutic exercise;Balance training;Neuromuscular re-education;Patient/family education;Orthotic Fit/Training;Manual techniques;Passive range of motion;Energy conservation    PT Next Visit Plan  progress balance intervention with compliant surfaces and dynamic balance; airex tandem stance    Consulted and Agree with Plan of Care  Patient       Patient will benefit from skilled therapeutic intervention in order to improve the following deficits and impairments:  Abnormal gait, Decreased activity tolerance, Decreased balance, Decreased endurance, Decreased mobility, Difficulty walking, Decreased strength, Improper body mechanics, Decreased range of motion  Visit Diagnosis: Unsteadiness on feet  Other abnormalities of gait and mobility  Muscle weakness (generalized)     Problem List Patient Active Problem List   Diagnosis Date Noted  . 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 Plantar fasciitis, bilateral 02/23/2019  . Dermatitis 02/23/2019   Rhandi Despain A. [address], SPT  [place], [place] D[place] 03/27/2019, 11:00 AM  [facility] MAIN [facility] [address] [place], [place], [address] Phone: [phone]   Fax:  [phone]  Name: Patricia Hahn MRN: [id number] Date of Birth: 12-07-1945

## 2019-03-29 ENCOUNTER — Encounter: Payer: Self-pay | Admitting: Physical Therapy

## 2019-03-29 ENCOUNTER — Ambulatory Visit: Payer: Medicare HMO | Admitting: Physical Therapy

## 2019-03-29 ENCOUNTER — Other Ambulatory Visit: Payer: Self-pay

## 2019-03-29 DIAGNOSIS — M6281 Muscle weakness (generalized): Secondary | ICD-10-CM

## 2019-03-29 DIAGNOSIS — R2689 Other abnormalities of gait and mobility: Secondary | ICD-10-CM

## 2019-03-29 DIAGNOSIS — R2681 Unsteadiness on feet: Secondary | ICD-10-CM | POA: Diagnosis not present

## 2019-03-29 NOTE — Therapy (Signed)
[facility] MAIN [facility] [address] [place], [place], [address] Phone: [phone]   Fax:  [phone]  Physical Therapy Treatment  Patient Details  Name: Patricia Hahn MRN: [id number] Date of Birth: [birth year] Referring Provider (PT): Yves DillNeelam Khan   Encounter Date: 03/29/2019  PT End of Session - 03/29/19 0916    Visit Number  6    Number of Visits  17    Date for PT Re-Evaluation  05/08/19    PT Start Time  0910    PT Stop Time  0955    PT Time Calculation (min)  45 min    Equipment Utilized During Treatment  Gait belt    Activity Tolerance  Patient tolerated treatment well;No increased pain    Behavior During Therapy  [facility] for tasks assessed/performed       History reviewed. No pertinent past medical history.  Past Surgical History:  Procedure Laterality Date  . ABDOMINAL HYSTERECTOMY    . APPENDECTOMY    . CERVICAL FUSION    . COLONOSCOPY N/A 10/19/2014   Procedure: COLONOSCOPY;  Surgeon: Wallace CullensPaul Y Oh, MD;  [facility];  Service: Gastroenterology;  Laterality: N/A;  . ORIF ANKLE FRACTURE    . SHOULDER ACROMIOPLASTY      There were no vitals filed for this visit.  Subjective Assessment - 03/29/19 0910    Subjective  Patient reports that she stubbed her toe on Monday night, claims that it is bruised and is experiencing some tingling this date.    Pertinent History  Patient is a 73 y.o. female with Hx of R ankle fracture in 2004 after falling off ladder, bilateral plantar fasciitis, shoulder acromioplasty. Is a current smoker of 2 cigarettes/day. Patient received physical therapy in 2017 for balance, which patient claimed resulted in significant improvements. She has c/o frequent LOB while standing, walking, bending over, and lifting, with 2 reported falls in the past 6 months.    Limitations  Lifting;Standing;Walking    How long can you sit comfortably?  N/A; can sit all day.    How long can you stand  comfortably?  Able to stand 4 hour shifts at work    How long can you walk comfortably?  Able to work 4 hour shift without stops, uses shopping cart for assistance.    Patient Stated Goals  improve walking and balance    Currently in Pain?  Yes    Pain Score  2     Pain Location  Toe (Comment which one)   4th toe   Pain Orientation  Left    Pain Descriptors / Indicators  Tingling;Aching    Pain Type  Acute pain    Pain Onset  In the past 7 days    Pain Frequency  Constant    Aggravating Factors   Extensive time on her feet    Pain Relieving Factors  rest    Multiple Pain Sites  No      Neuro Re-ed: Patient requires CGA during all standing interventions due to limited stability and patient fear of LOB. Cueing for reduction of UE support required as well as postural alignment for optimal stability within COM   This entire session was performed under direct supervision and direction of a licensed therapist/therapist assistant . I have personally read, edited and approve of the note as written. Treatment:   Therex:   -Leg press 120# 2x20 reps; demonstrates good eccentric control   -Single leg calf raises with UE  support x20 BLE   In // bars:   -Marching length of // bars fwd/bwd with 3# ankle weights x3 laps; cues to lift feet high   -Side-stepping length of // bars with 3# ankle weights x3 laps   -Standing hip ext x20 with 3# ankle weights BLE   NMRE:   -Tandem stance on airex foam 2x30 sec hold followed by head turns x10, each LE forward; initially very challenging for patient, required frequent UE support and correctional stepping for balance, however improved to no UE support with repetition.    -Tandem stance on airex foam with ball passes side/side x10, each LE forward   -Tandem gait along long airex foam x3 laps fwd/bwd; occasional UE support, difficult for patient  -Side stepping along long airex foam x3 laps side/side   -Lunge to bosu ball holding small red ball with  trunk rotation side/side x10 each LE forward; cues to go slowly and turn head so she is looking at ball in her hands    Pt educated throughout session about proper posture and technique with exercises. Improved exercise technique, movement at target joints, use of target muscles after min to mod verbal, visual, tactile cues.   Patient demonstrates excellent motivation throughout today's session. Advanced static and dynamic balance intevention, required min VC for posture and occasional UE support. Patient continues to be challenged by tandem stance and tandem gait on compliant surfaces, which improved with repetition and cues to go slowly. Patient would continue to benefit from skilled PT to address the deficits outlined in this note.          PT Education - 03/29/19 0916    Education Details  HEP, strengthening, balance    Person(s) Educated  Patient    Methods  Explanation;Demonstration    Comprehension  Verbalized understanding;Need further instruction       PT Short Term Goals - 03/13/19 0950      PT SHORT TERM GOAL #1   Title  Patient will be independent in initial home exercise program to improve strength/mobility for better functional independence with ADLs    Baseline  Provided HEP 03/13/19    Time  4    Period  Weeks    Status  New    Target Date  04/10/19      PT SHORT TERM GOAL #2   Title  Patient will increase normal walking speed 10 meter walk test to >1.92m/s as to improve gait speed for better community ambulation and to reduce fall risk.    Baseline  03/13/19 14.13 m/s normal walking speed with frequent LOB.    Time  4    Period  Weeks    Status  New    Target Date  04/10/19        PT Long Term Goals - 03/13/19 0951      PT LONG TERM GOAL #1   Title  Patient will be independent in finalized home exercise program to improve strength/mobility for better functional independence with ADLs    Time  8    Period  Weeks    Status  New    Target Date  05/08/19       PT LONG TERM GOAL #2   Title  Patient will ascend/descend 4 stairs without rail assist independently without loss of balance to improve ability to get in/out of home.    Baseline  03/13/19 Patient states she uses 1-2 HHA to climb 6 stairs to enter/exit home.    Time  8    Period  Weeks    Status  New    Target Date  05/08/19      PT LONG TERM GOAL #3   Title  Patient will increase Berg Balance score by > 6 points to demonstrate decreased fall risk during functional activities.    Baseline  03/13/19 46/56 BBT.    Time  8    Period  Weeks    Status  New    Target Date  05/08/19      PT LONG TERM GOAL #4   Title  Patient will be independent in bending down towards floor and picking up small object (<5 pounds) and then stand back up without loss of balance as to improve ability to pick up and clean up room at home and perform vocational duties.    Baseline  03/13/19 LOB when bending over and lifting.    Time  8    Period  Weeks    Status  New    Target Date  05/08/19      PT LONG TERM GOAL #5   Title  Pt will increase gross LE strength to 5/5 for improved ability to complete ADLs and IADLs.    Baseline  03/13/19 R>L weakness, ankle DF, hip ext, hip flex, hip abd from 3+ to 4+/5.    Time  8    Period  Weeks    Status  New    Target Date  05/08/19            Plan - 03/29/19 1002    Clinical Impression Statement  Patient demonstrates excellent motivation throughout today's session. Advanced static and dynamic balance intevention, required min VC for posture and occasional UE support. Patient continues to be challenged by tandem stance and tandem gait on compliant surfaces, which improved with repetition and cues to go slowly. Patient would continue to benefit from skilled PT to address the deficits outlined in this note.    Personal Factors and Comorbidities  Age;Fitness;Profession    Examination-Activity Limitations  Bend;Carry;Lift;Locomotion Level;Reach  Overhead;Squat;Stairs;Stand;Transfers    Examination-Participation Restrictions  Yard Work;Shop    Stability/Clinical Decision Making  Stable/Uncomplicated    Rehab Potential  Excellent    PT Frequency  2x / week    PT Duration  8 weeks    PT Treatment/Interventions  ADLs/Self Care Home Management;Aquatic Therapy;Cryotherapy;Electrical Stimulation;Moist Heat;Ultrasound;Gait training;Stair training;Functional mobility training;Therapeutic activities;Therapeutic exercise;Balance training;Neuromuscular re-education;Patient/family education;Orthotic Fit/Training;Manual techniques;Passive range of motion;Energy conservation    PT Next Visit Plan  progress balance intervention with compliant surfaces and dynamic balance; airex tandem stance    Consulted and Agree with Plan of Care  Patient       Patient will benefit from skilled therapeutic intervention in order to improve the following deficits and impairments:  Abnormal gait, Decreased activity tolerance, Decreased balance, Decreased endurance, Decreased mobility, Difficulty walking, Decreased strength, Improper body mechanics, Decreased range of motion  Visit Diagnosis: Other abnormalities of gait and mobility  Muscle weakness (generalized)  Unsteadiness on feet     Problem List Patient Active Problem List   Diagnosis Date Noted  . 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 Plantar fasciitis, bilateral 02/23/2019  . Dermatitis 02/23/2019   Patricia Hahn, Patricia  Hahn, Patricia Hahn, Patricia Hahn 03/29/2019, 10:11 AM  [place] MAIN [facility] SERVICES [address] [place], [place], [address] Phone: [phone]   Fax:  [phone]  Name: Patricia Hahn MRN: [id number] Date of Birth: February 17, 1946

## 2019-04-03 ENCOUNTER — Other Ambulatory Visit: Payer: Self-pay

## 2019-04-03 ENCOUNTER — Ambulatory Visit: Payer: Medicare HMO | Attending: Neurology | Admitting: Physical Therapy

## 2019-04-03 DIAGNOSIS — R2689 Other abnormalities of gait and mobility: Secondary | ICD-10-CM | POA: Diagnosis not present

## 2019-04-03 DIAGNOSIS — R2681 Unsteadiness on feet: Secondary | ICD-10-CM | POA: Insufficient documentation

## 2019-04-03 DIAGNOSIS — M6281 Muscle weakness (generalized): Secondary | ICD-10-CM | POA: Insufficient documentation

## 2019-04-03 NOTE — Therapy (Signed)
[place] MAIN [facility] SERVICES [address] [place], [place], [address] Phone: [phone]   Fax:  [phone]  Physical Therapy Treatment  Patient Details  Name: Patricia Hahn MRN: [id number] Date of Birth: Jun 18, 1945 Referring Provider (PT): Lamonte Sakai   Encounter Date: 04/03/2019  PT End of Session - 04/03/19 0849    Visit Number  7    Number of Visits  17    Date for PT Re-Evaluation  05/08/19    PT Start Time  0845    PT Stop Time  0925    PT Time Calculation (min)  40 min    Equipment Utilized During Treatment  Gait belt    Activity Tolerance  Patient tolerated treatment well;No increased pain    Behavior During Therapy  [facility] for tasks assessed/performed       No past medical history on file.  Past Surgical History:  Procedure Laterality Date  . ABDOMINAL HYSTERECTOMY    . APPENDECTOMY    . CERVICAL FUSION    . COLONOSCOPY N/A 10/19/2014   Procedure: COLONOSCOPY;  Surgeon: Hulen Luster, MD;  Location: [facility] ENDOSCOPY;  Service: Gastroenterology;  Laterality: N/A;  . ORIF ANKLE FRACTURE    . SHOULDER ACROMIOPLASTY      There were no vitals filed for this visit.  Subjective Assessment - 04/03/19 0847    Subjective  Patient reports that she stubbed her toe on Monday night, claims that it is bruised and is experiencing some tingling this date.    Pertinent History  Patient is a 73 y.o. female with Hx of R ankle fracture in 2004 after falling off ladder, bilateral plantar fasciitis, shoulder acromioplasty. Is a current smoker of 2 cigarettes/day. Patient received physical therapy in 2017 for balance, which patient claimed resulted in significant improvements. She has c/o frequent LOB while standing, walking, bending over, and lifting, with 2 reported falls in the past 6 months.    Limitations  Lifting;Standing;Walking    How long can you sit comfortably?  N/A; can sit all day.    How long can you stand comfortably?  Able to stand  4 hour shifts at work    How long can you walk comfortably?  Able to work 4 hour shift without stops, uses shopping cart for assistance.    Patient Stated Goals  improve walking and balance    Currently in Pain?  No/denies    Pain Score  0-No pain    Pain Onset  In the past 7 days       Neuromuscular Re-education  Rocker board fwd/bwd, side to side x 20 each direction Tandem gait on 2"x4" without UE support x 2 lengths Side stepping on 2"x4" without UE support x 2 lengths Heel/toe raises without UE support 3s hold x 10 each 1/2 foam roll balance with flat side up 30s x 2 reps 1/2 foam roll balance with flat side down 30s x 2 reps 1/2 foam roll tandem balance alternating forward LE 30s x 2 each LE forward Lateral side steps from foam to 6 inch stool left and right x 15 Backwards stepping from foam to 6 inch stool x 15  [place] fwd/bwd, side to side , diagonal x 10 ,cues for posture and stepping strategies, occasional LOB Star stepping left and right x 10  Leg press 120 lbs x 20 x 3, heel raises x 20 x 3 60 lbs      Pt educated throughout session about proper posture  and technique with exercises. Improved exercise technique, movement at target joints, use of target muscles after min to mod verbal, visual, tactile cues. CGA and Min to mod verbal cues used throughout with increased in postural sway and LOB most seen with narrow base of support and while on uneven surfaces. Continues to have balance deficits typical with diagnosis. Patient performs intermediate level exercises without pain behaviors and needs verbal cuing for postural alignment and head positioning Tactile cues and assistance needed to keep lower leg and knee in neutral to avoid compensations with ankle motions.                         PT Education - 04/03/19 0847    Education Details  HEP    Person(s) Educated  Patient    Methods  Explanation    Comprehension  Verbalized understanding;Returned  demonstration       PT Short Term Goals - 03/13/19 0950      PT SHORT TERM GOAL #1   Title  Patient will be independent in initial home exercise program to improve strength/mobility for better functional independence with ADLs    Baseline  Provided HEP 03/13/19    Time  4    Period  Weeks    Status  New    Target Date  04/10/19      PT SHORT TERM GOAL #2   Title  Patient will increase normal walking speed 10 meter walk test to >1.8264m/s as to improve gait speed for better community ambulation and to reduce fall risk.    Baseline  03/13/19 14.13 m/s normal walking speed with frequent LOB.    Time  4    Period  Weeks    Status  New    Target Date  04/10/19        PT Long Term Goals - 03/13/19 0951      PT LONG TERM GOAL #1   Title  Patient will be independent in finalized home exercise program to improve strength/mobility for better functional independence with ADLs    Time  8    Period  Weeks    Status  New    Target Date  05/08/19      PT LONG TERM GOAL #2   Title  Patient will ascend/descend 4 stairs without rail assist independently without loss of balance to improve ability to get in/out of home.    Baseline  03/13/19 Patient states she uses 1-2 HHA to climb 6 stairs to enter/exit home.    Time  8    Period  Weeks    Status  New    Target Date  05/08/19      PT LONG TERM GOAL #3   Title  Patient will increase Berg Balance score by > 6 points to demonstrate decreased fall risk during functional activities.    Baseline  03/13/19 46/56 BBT.    Time  8    Period  Weeks    Status  New    Target Date  05/08/19      PT LONG TERM GOAL #4   Title  Patient will be independent in bending down towards floor and picking up small object (<5 pounds) and then stand back up without loss of balance as to improve ability to pick up and clean up room at home and perform vocational duties.    Baseline  03/13/19 LOB when bending over and lifting.    Time  8  Period  Weeks     Status  New    Target Date  05/08/19      PT LONG TERM GOAL #5   Title  Pt will increase gross LE strength to 5/5 for improved ability to complete ADLs and IADLs.    Baseline  03/13/19 R>L weakness, ankle DF, hip ext, hip flex, hip abd from 3+ to 4+/5.    Time  8    Period  Weeks    Status  New    Target Date  05/08/19            Plan - 04/03/19 0850    Clinical Impression Statement   Pt was able to perform all exercises today with CGA.Marland Kitchen Pt was able to perform all balance and strength exercises, demonstrating improvements in LE strength and stability.  Pt was able to complete dynamic balance exercises, showing ability to stand on even surfaces with min assist and improve postural reactions to correct self during activities.  Pt requires verbal, visual and tactile cues during exercise in order to complete tasks with proper form and technique, as well as to stay on task.  Pt would continue to benefit from skilled PT services in order to further strengthen LE's, improve static and dynamic balance, and improve coordination in order to increase functional mobility and decrease risk of falls   Personal Factors and Comorbidities  Age;Fitness;Profession    Examination-Activity Limitations  Bend;Carry;Lift;Locomotion Level;Reach Overhead;Squat;Stairs;Stand;Transfers    Examination-Participation Restrictions  Yard Work;Shop    Stability/Clinical Decision Making  Stable/Uncomplicated    Rehab Potential  Excellent    PT Frequency  2x / week    PT Duration  8 weeks    PT Treatment/Interventions  ADLs/Self Care Home Management;Aquatic Therapy;Cryotherapy;Electrical Stimulation;Moist Heat;Ultrasound;Gait training;Stair training;Functional mobility training;Therapeutic activities;Therapeutic exercise;Balance training;Neuromuscular re-education;Patient/family education;Orthotic Fit/Training;Manual techniques;Passive range of motion;Energy conservation    PT Next Visit Plan  progress balance intervention  with compliant surfaces and dynamic balance; airex tandem stance    Consulted and Agree with Plan of Care  Patient       Patient will benefit from skilled therapeutic intervention in order to improve the following deficits and impairments:  Abnormal gait, Decreased activity tolerance, Decreased balance, Decreased endurance, Decreased mobility, Difficulty walking, Decreased strength, Improper body mechanics, Decreased range of motion  Visit Diagnosis: Other abnormalities of gait and mobility  Muscle weakness (generalized)  Unsteadiness on feet     Problem List Patient Active Problem List   Diagnosis Date Noted  . 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 Plantar fasciitis, bilateral 02/23/2019  . Dermatitis 02/23/2019    Ezekiel Ina, PT DPT 04/03/2019, 8:54 AM  [facility] MAIN [facility] SERVICES [address] [place], [place], [address] Phone: [phone]   Fax:  [phone]  Name: Emmali Karow MRN: [id number] Date of Birth: 02-May-1946

## 2019-04-05 ENCOUNTER — Ambulatory Visit: Payer: Medicare HMO | Admitting: Physical Therapy

## 2019-04-10 ENCOUNTER — Ambulatory Visit: Payer: Medicare HMO | Admitting: Physical Therapy

## 2019-04-10 ENCOUNTER — Other Ambulatory Visit: Payer: Self-pay

## 2019-04-10 ENCOUNTER — Encounter: Payer: Self-pay | Admitting: Physical Therapy

## 2019-04-10 DIAGNOSIS — M6281 Muscle weakness (generalized): Secondary | ICD-10-CM

## 2019-04-10 DIAGNOSIS — R2681 Unsteadiness on feet: Secondary | ICD-10-CM

## 2019-04-10 DIAGNOSIS — R2689 Other abnormalities of gait and mobility: Secondary | ICD-10-CM

## 2019-04-10 NOTE — Therapy (Signed)
[facility] [address] [place], [place], [address] Phone: [phone]   Fax:  [phone]  Physical Therapy Treatment  Patient Details  Name: Patricia Hahn MRN: [id number] Date of Birth: 11/29/1945 Referring Provider (PT): Yves DillNeelam Khan   Encounter Date: 04/10/2019  PT End of Session - 04/10/19 0850    Visit Number  8    Number of Visits  17    Date for PT Re-Evaluation  05/08/19    PT Start Time  0845    PT Stop Time  0925    PT Time Calculation (min)  40 min    Equipment Utilized During Treatment  Gait belt    Activity Tolerance  Patient tolerated treatment well;No increased pain    Behavior During Therapy  [facility] for tasks assessed/performed       History reviewed. No pertinent past medical history.  Past Surgical History:  Procedure Laterality Date  . ABDOMINAL HYSTERECTOMY    . APPENDECTOMY    . CERVICAL FUSION    . COLONOSCOPY N/A 10/19/2014   Procedure: COLONOSCOPY;  Surgeon: Wallace CullensPaul Y Oh, MD;  [facility];  Service: Gastroenterology;  Laterality: N/A;  . ORIF ANKLE FRACTURE    . SHOULDER ACROMIOPLASTY      There were no vitals filed for this visit.  Subjective Assessment - 04/10/19 0849    Subjective  Patient is doing well today, still has a sore toe    Pertinent History  Patient is a 73 y.o. female with Hx of R ankle fracture in 2004 after falling off ladder, bilateral plantar fasciitis, shoulder acromioplasty. Is a current smoker of 2 cigarettes/day. Patient received physical therapy in 2017 for balance, which patient claimed resulted in significant improvements. She has c/o frequent LOB while standing, walking, bending over, and lifting, with 2 reported falls in the past 6 months.    Limitations  Lifting;Standing;Walking    How long can you sit comfortably?  N/A; can sit all day.    How long can you stand comfortably?  Able to stand 4 hour shifts at work    How long can you walk  comfortably?  Able to work 4 hour shift without stops, uses shopping cart for assistance.    Patient Stated Goals  improve walking and balance    Currently in Pain?  No/denies    Pain Score  0-No pain    Pain Onset  In the past 7 days      Treatment: Leg press 90 lbs x 20 x 2  Leg press 45 lbs x 20 x 2 heel raises   Neuromuscular Re-education  Rocker board fwd/bwd, side to side x 20 each direction Tandem gait on balance beam without UE support x 2 lengths Side stepping on balance beam without UE support x 2 lengths Heel/toe raises without UE support 3s hold x 10 each 1/2 foam roll balance with flat side up 30s x 2 reps 1/2 foam roll balance with flat side down 30s x 2 reps 1/2 foam roll tandem balance alternating forward LE 30s x 2 each LE forward  step up from foam to 6 inch stool left and right x 15 Cues for technique, movement at target joints, use of target muscles after min to mod verbal, visual, tactile cues. CGA and Min to mod verbal cues used throughout with increased in postural sway and LOB most seen with narrow base of support and while on uneven surfaces. Continues to have balance deficits typical  with diagnosis. Patient performs intermediate level exercises without pain behaviors and needs verbal cuing for postural alignment and head positioning                       PT Education - 04/10/19 0850    Education Details  HEP    Person(s) Educated  Patient    Methods  Explanation    Comprehension  Verbalized understanding;Returned demonstration;Need further instruction       PT Short Term Goals - 03/13/19 0950      PT SHORT TERM GOAL #1   Title  Patient will be independent in initial home exercise program to improve strength/mobility for better functional independence with ADLs    Baseline  Provided HEP 03/13/19    Time  4    Period  Weeks    Status  New    Target Date  04/10/19      PT SHORT TERM GOAL #2   Title  Patient will increase normal  walking speed 10 meter walk test to >1.74m/s as to improve gait speed for better community ambulation and to reduce fall risk.    Baseline  03/13/19 14.13 m/s normal walking speed with frequent LOB.    Time  4    Period  Weeks    Status  New    Target Date  04/10/19        PT Long Term Goals - 03/13/19 0951      PT LONG TERM GOAL #1   Title  Patient will be independent in finalized home exercise program to improve strength/mobility for better functional independence with ADLs    Time  8    Period  Weeks    Status  New    Target Date  05/08/19      PT LONG TERM GOAL #2   Title  Patient will ascend/descend 4 stairs without rail assist independently without loss of balance to improve ability to get in/out of home.    Baseline  03/13/19 Patient states she uses 1-2 HHA to climb 6 stairs to enter/exit home.    Time  8    Period  Weeks    Status  New    Target Date  05/08/19      PT LONG TERM GOAL #3   Title  Patient will increase Berg Balance score by > 6 points to demonstrate decreased fall risk during functional activities.    Baseline  03/13/19 46/56 BBT.    Time  8    Period  Weeks    Status  New    Target Date  05/08/19      PT LONG TERM GOAL #4   Title  Patient will be independent in bending down towards floor and picking up small object (<5 pounds) and then stand back up without loss of balance as to improve ability to pick up and clean up room at home and perform vocational duties.    Baseline  03/13/19 LOB when bending over and lifting.    Time  8    Period  Weeks    Status  New    Target Date  05/08/19      PT LONG TERM GOAL #5   Title  Pt will increase gross LE strength to 5/5 for improved ability to complete ADLs and IADLs.    Baseline  03/13/19 R>L weakness, ankle DF, hip ext, hip flex, hip abd from 3+ to 4+/5.    Time  8  Period  Weeks    Status  New    Target Date  05/08/19            Plan - 04/10/19 0850    Clinical Impression Statement  Pt  demonstrates increased postural sway when standing on uneven surface and requires // bars to steady, and demonstrates fatigue at end of set of exercises focused on strength and endurance.  Patient will continue to benefit from skilled PT for improved balance and strength   Personal Factors and Comorbidities  Age;Fitness;Profession    Examination-Activity Limitations  Bend;Carry;Lift;Locomotion Level;Reach Overhead;Squat;Stairs;Stand;Transfers    Examination-Participation Restrictions  Yard Work;Shop    Stability/Clinical Decision Making  Stable/Uncomplicated    Rehab Potential  Excellent    PT Frequency  2x / week    PT Duration  8 weeks    PT Treatment/Interventions  ADLs/Self Care Home Management;Aquatic Therapy;Cryotherapy;Electrical Stimulation;Moist Heat;Ultrasound;Gait training;Stair training;Functional mobility training;Therapeutic activities;Therapeutic exercise;Balance training;Neuromuscular re-education;Patient/family education;Orthotic Fit/Training;Manual techniques;Passive range of motion;Energy conservation    PT Next Visit Plan  progress balance intervention with compliant surfaces and dynamic balance; airex tandem stance    Consulted and Agree with Plan of Care  Patient       Patient will benefit from skilled therapeutic intervention in order to improve the following deficits and impairments:  Abnormal gait, Decreased activity tolerance, Decreased balance, Decreased endurance, Decreased mobility, Difficulty walking, Decreased strength, Improper body mechanics, Decreased range of motion  Visit Diagnosis: Other abnormalities of gait and mobility  Muscle weakness (generalized)  Unsteadiness on feet     Problem List Patient Active Problem List   Diagnosis Date Noted  . 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 Plantar fasciitis, bilateral 02/23/2019  . Dermatitis 02/23/2019    Ezekiel Ina, PT DPT 04/10/2019, 8:51 AM  [facility] MAIN  [facility] SERVICES [address] [place], [place], [address] Phone: [phone]   Fax:  [phone]  Name: Patricia Hahn MRN: [id number] Date of Birth: August 20, 1945

## 2019-04-12 ENCOUNTER — Ambulatory Visit: Payer: Medicare HMO | Admitting: Physical Therapy

## 2019-04-12 ENCOUNTER — Other Ambulatory Visit: Payer: Self-pay

## 2019-04-12 ENCOUNTER — Encounter: Payer: Self-pay | Admitting: Physical Therapy

## 2019-04-12 DIAGNOSIS — R2681 Unsteadiness on feet: Secondary | ICD-10-CM

## 2019-04-12 DIAGNOSIS — R2689 Other abnormalities of gait and mobility: Secondary | ICD-10-CM

## 2019-04-12 DIAGNOSIS — M6281 Muscle weakness (generalized): Secondary | ICD-10-CM

## 2019-04-12 NOTE — Therapy (Signed)
[place] MAIN [facility] SERVICES [address] [place], [place], [address] Phone: [phone]   Fax:  [phone]  Physical Therapy Treatment  Patient Details  Name: Patricia Hahn MRN: [id number] Date of Birth: [birth year] Referring Provider (PT): Lamonte Sakai   Encounter Date: 04/12/2019  PT End of Session - 04/12/19 0935    Visit Number  9    Number of Visits  17    Date for PT Re-Evaluation  05/08/19    PT Start Time  0931    PT Stop Time  1009    PT Time Calculation (min)  38 min    Equipment Utilized During Treatment  Gait belt    Activity Tolerance  Patient tolerated treatment well;No increased pain    Behavior During Therapy  [facility] for tasks assessed/performed       History reviewed. No pertinent past medical history.  Past Surgical History:  Procedure Laterality Date  . ABDOMINAL HYSTERECTOMY    . APPENDECTOMY    . CERVICAL FUSION    . COLONOSCOPY N/A 10/19/2014   Procedure: COLONOSCOPY;  Surgeon: Hulen Luster, MD;  Location: [facility] ENDOSCOPY;  Service: Gastroenterology;  Laterality: N/A;  . ORIF ANKLE FRACTURE    . SHOULDER ACROMIOPLASTY      There were no vitals filed for this visit.  Subjective Assessment - 04/12/19 0934    Subjective  Patient is doing well today,    Pertinent History  Patient is a 73 y.o. female with Hx of R ankle fracture in 2004 after falling off ladder, bilateral plantar fasciitis, shoulder acromioplasty. Is a current smoker of 2 cigarettes/day. Patient received physical therapy in 2017 for balance, which patient claimed resulted in significant improvements. She has c/o frequent LOB while standing, walking, bending over, and lifting, with 2 reported falls in the past 6 months.    Limitations  Lifting;Standing;Walking    How long can you sit comfortably?  N/A; can sit all day.    How long can you stand comfortably?  Able to stand 4 hour shifts at work    How long can you walk comfortably?  Able to work 4  hour shift without stops, uses shopping cart for assistance.    Patient Stated Goals  improve walking and balance    Currently in Pain?  No/denies    Pain Score  0-No pain    Pain Onset  In the past 7 days         Treatment:  Neuromuscular Training: Stool: staggered stance, head turns side/side, up/down x 5 reps each, each foot in front; VCs for proper technique and positioning for each exercise staggered stance, trunk rotation with 2 lb rod, VC to keep UE straight and turn head with trunk  Rockerboard Lateral weight shift x10 reps each direction, no UE support, CGA for safety with VCs to tap in each direction, controlling the speed Anterior/Posterior weight shift x10 reps, no UE support, CGA for safety with VCs to utilize ankles to shift weight over toes and back to heels  Hurdle: Forward and backward stepping over hurdle x15 each direction, VCs to take big enough steps and to try to increase speed to work on coordination  Side stepping over hurdle 15x each direction  Blue Foam: Side stepping x10 on blue balance CGA for safety, VCs for taking a big enough step  Airex pad trunk rotation x2 min, CGA for safety, demonstrated difficulty with keeping arms extended and full rotation with head turn Airex pad,  balloon tapping to mirror x2 min, supervision for safety with varying directions and speed of balloon, VCs for utilizing both hands and minimizing UE support  Matrix: Fwd/bwd gait with 22. 5 lbs and CGA, cues for posture and stepping strategies, occasional LOB Side stepping left and right and CGA with cues to slow movement  Heel raises for 2 sec hold x 20 with UE assist 50%   Pt educated throughout session about proper posture and technique with exercises. Improved exercise technique, movement at target joints, use of target muscles after min to mod verbal, visual, tactile cues. CGA and Min to mod verbal cues used throughout with increased in postural sway and LOB most seen with  narrow base of support and while on uneven surfaces. Continues to have balance deficits typical with diagnosis. Patient performs intermediate level exercises without pain behaviors and needs verbal cuing for postural alignment and head positioning                      PT Education - 04/12/19 0935    Education Details  HEP    Person(s) Educated  Patient    Methods  Explanation    Comprehension  Verbalized understanding;Returned demonstration;Need further instruction       PT Short Term Goals - 03/13/19 0950      PT SHORT TERM GOAL #1   Title  Patient will be independent in initial home exercise program to improve strength/mobility for better functional independence with ADLs    Baseline  Provided HEP 03/13/19    Time  4    Period  Weeks    Status  New    Target Date  04/10/19      PT SHORT TERM GOAL #2   Title  Patient will increase normal walking speed 10 meter walk test to >1.1220m/s as to improve gait speed for better community ambulation and to reduce fall risk.    Baseline  03/13/19 14.13 m/s normal walking speed with frequent LOB.    Time  4    Period  Weeks    Status  New    Target Date  04/10/19        PT Long Term Goals - 03/13/19 0951      PT LONG TERM GOAL #1   Title  Patient will be independent in finalized home exercise program to improve strength/mobility for better functional independence with ADLs    Time  8    Period  Weeks    Status  New    Target Date  05/08/19      PT LONG TERM GOAL #2   Title  Patient will ascend/descend 4 stairs without rail assist independently without loss of balance to improve ability to get in/out of home.    Baseline  03/13/19 Patient states she uses 1-2 HHA to climb 6 stairs to enter/exit home.    Time  8    Period  Weeks    Status  New    Target Date  05/08/19      PT LONG TERM GOAL #3   Title  Patient will increase Berg Balance score by > 6 points to demonstrate decreased fall risk during functional  activities.    Baseline  03/13/19 46/56 BBT.    Time  8    Period  Weeks    Status  New    Target Date  05/08/19      PT LONG TERM GOAL #4   Title  Patient will  be independent in bending down towards floor and picking up small object (<5 pounds) and then stand back up without loss of balance as to improve ability to pick up and clean up room at home and perform vocational duties.    Baseline  03/13/19 LOB when bending over and lifting.    Time  8    Period  Weeks    Status  New    Target Date  05/08/19      PT LONG TERM GOAL #5   Title  Pt will increase gross LE strength to 5/5 for improved ability to complete ADLs and IADLs.    Baseline  03/13/19 R>L weakness, ankle DF, hip ext, hip flex, hip abd from 3+ to 4+/5.    Time  8    Period  Weeks    Status  New    Target Date  05/08/19            Plan - 04/12/19 0936    Clinical Impression Statement  Pt demonstrates increased postural sway when standing on uneven surface and requires // bars to steady, and demonstrates fatigue at end of set of exercises focused on strength and endurance.  Patient will continue to benefit from skilled PT for improved balance and strength   Personal Factors and Comorbidities  Age;Fitness;Profession    Examination-Activity Limitations  Bend;Carry;Lift;Locomotion Level;Reach Overhead;Squat;Stairs;Stand;Transfers    Examination-Participation Restrictions  Yard Work;Shop    Stability/Clinical Decision Making  Stable/Uncomplicated    Rehab Potential  Excellent    PT Frequency  2x / week    PT Duration  8 weeks    PT Treatment/Interventions  ADLs/Self Care Home Management;Aquatic Therapy;Cryotherapy;Electrical Stimulation;Moist Heat;Ultrasound;Gait training;Stair training;Functional mobility training;Therapeutic activities;Therapeutic exercise;Balance training;Neuromuscular re-education;Patient/family education;Orthotic Fit/Training;Manual techniques;Passive range of motion;Energy conservation    PT Next  Visit Plan  progress balance intervention with compliant surfaces and dynamic balance; airex tandem stance    Consulted and Agree with Plan of Care  Patient       Patient will benefit from skilled therapeutic intervention in order to improve the following deficits and impairments:  Abnormal gait, Decreased activity tolerance, Decreased balance, Decreased endurance, Decreased mobility, Difficulty walking, Decreased strength, Improper body mechanics, Decreased range of motion  Visit Diagnosis: Other abnormalities of gait and mobility  Muscle weakness (generalized)  Unsteadiness on feet     Problem List Patient Active Problem List   Diagnosis Date Noted  . 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 Plantar fasciitis, bilateral 02/23/2019  . Dermatitis 02/23/2019    Ezekiel Ina, PT DPT 04/12/2019, 9:36 AM  [place] [facility] MAIN [facility] SERVICES [address] [place], [place], [address] Phone: [phone]   Fax:  [phone]  Name: Patricia Hahn MRN: [id number] Date of Birth: Feb 04, 1946

## 2019-04-17 ENCOUNTER — Ambulatory Visit: Payer: Medicare HMO | Admitting: Physical Therapy

## 2019-04-19 ENCOUNTER — Ambulatory Visit: Payer: Medicare HMO | Admitting: Physical Therapy

## 2019-04-19 ENCOUNTER — Encounter: Payer: Self-pay | Admitting: Physical Therapy

## 2019-04-19 ENCOUNTER — Other Ambulatory Visit: Payer: Self-pay

## 2019-04-19 DIAGNOSIS — M6281 Muscle weakness (generalized): Secondary | ICD-10-CM

## 2019-04-19 DIAGNOSIS — R2689 Other abnormalities of gait and mobility: Secondary | ICD-10-CM | POA: Diagnosis not present

## 2019-04-19 DIAGNOSIS — R2681 Unsteadiness on feet: Secondary | ICD-10-CM

## 2019-04-19 NOTE — Therapy (Signed)
[place] MAIN [facility] SERVICES [address] [place], [place], [address] Phone: [phone]   Fax:  [phone]  Physical Therapy Treatment Physical Therapy Progress Note   Dates of reporting period  03/13/19  to  04/19/19  Patient Details  Name: Patricia Hahn MRN: [id number] Date of Birth: 08-15-1945 Referring Provider (PT): Lamonte Sakai   Encounter Date: 04/19/2019  PT End of Session - 04/19/19 0852    Visit Number  10    Number of Visits  17    Date for PT Re-Evaluation  05/08/19    PT Start Time  0946    PT Stop Time  1025    PT Time Calculation (min)  39 min    Equipment Utilized During Treatment  Gait belt    Activity Tolerance  Patient tolerated treatment well;No increased pain    Behavior During Therapy  [facility] for tasks assessed/performed       History reviewed. No pertinent past medical history.  Past Surgical History:  Procedure Laterality Date  . ABDOMINAL HYSTERECTOMY    . APPENDECTOMY    . CERVICAL FUSION    . COLONOSCOPY N/A 10/19/2014   Procedure: COLONOSCOPY;  Surgeon: Hulen Luster, MD;  Location: [facility] ENDOSCOPY;  Service: Gastroenterology;  Laterality: N/A;  . ORIF ANKLE FRACTURE    . SHOULDER ACROMIOPLASTY      There were no vitals filed for this visit.  Subjective Assessment - 04/19/19 0852    Subjective  Patient is doing well today,    Pertinent History  Patient is a 73 y.o. female with Hx of R ankle fracture in 2004 after falling off ladder, bilateral plantar fasciitis, shoulder acromioplasty. Is a current smoker of 2 cigarettes/day. Patient received physical therapy in 2017 for balance, which patient claimed resulted in significant improvements. She has c/o frequent LOB while standing, walking, bending over, and lifting, with 2 reported falls in the past 6 months.    Limitations  Lifting;Standing;Walking    How long can you sit comfortably?  N/A; can sit all day.    How long can you stand comfortably?  Able  to stand 4 hour shifts at work    How long can you walk comfortably?  Able to work 4 hour shift without stops, uses shopping cart for assistance.    Patient Stated Goals  improve walking and balance    Currently in Pain?  No/denies    Pain Score  0-No pain    Pain Onset  In the past 7 days         [facility] PT Assessment - 04/19/19 0001      Berg Balance Test   Sit to Stand  Able to stand without using hands and stabilize independently    Standing Unsupported  Able to stand safely 2 minutes    Sitting with Back Unsupported but Feet Supported on Floor or Stool  Able to sit safely and securely 2 minutes    Stand to Sit  Sits safely with minimal use of hands    Transfers  Able to transfer safely, minor use of hands    Standing Unsupported with Eyes Closed  Able to stand 10 seconds safely    Standing Unsupported with Feet Together  Able to place feet together independently and stand 1 minute safely    From Standing, Reach Forward with Outstretched Arm  Can reach confidently >25 cm (10")    From Standing Position, Pick up Object from Floor  Able to pick  up shoe safely and easily    From Standing Position, Turn to Look Behind Over each Shoulder  Looks behind from both sides and weight shifts well    Turn 360 Degrees  Able to turn 360 degrees safely in 4 seconds or less    Standing Unsupported, Alternately Place Feet on Step/Stool  Able to stand independently and complete 8 steps >20 seconds    Standing Unsupported, One Foot in [place] to take small step independently and hold 30 seconds    Standing on One Leg  Unable to try or needs assist to prevent fall    Total Score  49       Therapeutic activities:  Tandem gait on purple foam  without UE support x 2 mins Side stepping over 1/2 foam  And hurdle with minimal UE support x 10 reps, 50% UE assist Heel/toe raises without UE support 3s hold x 10 each 1/2 foam roll balance with flat side up 30s x 2 reps 1/2 foam roll balance with flat  side down 30s x 2 reps Lateral side steps over hurdle  left and right x 15 Backwards stepping over hurdle x 15  Matrix 2 plates fwd/bwd, side to side x 3 reps   Berg balance test 49/56, all goals reviewed    Pt educated throughout session about proper posture and technique with exercises. Improved exercise technique, movement at target joints, use of target muscles after min to mod verbal, visual, tactile cues. CGA and Min to mod verbal cues used throughout with increased in postural sway and LOB most seen with narrow base of support and while on uneven surfaces. Continues to have balance deficits typical with diagnosis. Patient performs intermediate level exercises without pain behaviors and needs verbal cuing for postural alignment and head positioning                    PT Education - 04/19/19 0852    Education Details  HEP    Person(s) Educated  Patient    Methods  Explanation    Comprehension  Verbalized understanding;Need further instruction       PT Short Term Goals - 04/19/19 0853      PT SHORT TERM GOAL #1   Title  Patient will be independent in initial home exercise program to improve strength/mobility for better functional independence with ADLs    Baseline  Provided HEP 03/13/19    Time  4    Period  Weeks    Status  Achieved    Target Date  04/10/19      PT SHORT TERM GOAL #2   Title  Patient will increase normal walking speed 10 meter walk test to >1.101ms as to improve gait speed for better community ambulation and to reduce fall risk.    Baseline  03/13/19 14.13 m/s normal walking speed with frequent LOB.04/19/19=    Time  4    Period  Weeks    Status  Partially Met    Target Date  04/10/19        PT Long Term Goals - 04/19/19 0854      PT LONG TERM GOAL #1   Title  Patient will be independent in finalized home exercise program to improve strength/mobility for better functional independence with ADLs    Time  8    Period  Weeks    Status   On-going    Target Date  05/08/19      PT LONG TERM  GOAL #2   Title  Patient will ascend/descend 4 stairs without rail assist independently without loss of balance to improve ability to get in/out of home.    Baseline  03/13/19 Patient states she uses 1-2 HHA to climb 6 stairs to enter/exit home.    Time  8    Period  Weeks    Status  On-going    Target Date  05/08/19      PT LONG TERM GOAL #3   Title  Patient will increase Berg Balance score by > 6 points to demonstrate decreased fall risk during functional activities.    Baseline  03/13/19 46/56 BBT.04/19/19=49/56    Time  8    Period  Weeks    Status  Partially Met    Target Date  05/08/19      PT LONG TERM GOAL #4   Title  Patient will be independent in bending down towards floor and picking up small object (<5 pounds) and then stand back up without loss of balance as to improve ability to pick up and clean up room at home and perform vocational duties.    Baseline  03/13/19 LOB when bending over and lifting.    Time  8    Period  Weeks    Status  Partially Met    Target Date  05/08/19      PT LONG TERM GOAL #5   Title  Pt will increase gross LE strength to 5/5 for improved ability to complete ADLs and IADLs.    Baseline  03/13/19 R>L weakness, ankle DF, hip ext, hip flex, hip abd from 3+ to 4+/5.    Time  8    Period  Weeks    Status  Partially Met    Target Date  05/08/19            Plan - 04/19/19 9381    Clinical Impression Statement  Patient's condition has the potential to improve in response to therapy. Maximum improvement is yet to be obtained. The anticipated improvement is attainable and reasonable in a generally predictable time.  Patient reports that she is able to bend down better. Pt presents with unsteadiness on uneven surfaces and fatigues with therapeutic exercises. Patient needs assist with beginning moderate balance activities and needs CGA assist with standing activities. Patient demonstrates  difficulty with dynamic standing balance and with narrow base of support and increased challenges for LE.  Patient tolerated all interventions well this date and skilled PT will continue to improve mobility and strength.    Personal Factors and Comorbidities  Age;Fitness;Profession    Examination-Activity Limitations  Bend;Carry;Lift;Locomotion Level;Reach Overhead;Squat;Stairs;Stand;Transfers    Examination-Participation Restrictions  Yard Work;Shop    Stability/Clinical Decision Making  Stable/Uncomplicated    Rehab Potential  Excellent    PT Frequency  2x / week    PT Duration  8 weeks    PT Treatment/Interventions  ADLs/Self Care Home Management;Aquatic Therapy;Cryotherapy;Electrical Stimulation;Moist Heat;Ultrasound;Gait training;Stair training;Functional mobility training;Therapeutic activities;Therapeutic exercise;Balance training;Neuromuscular re-education;Patient/family education;Orthotic Fit/Training;Manual techniques;Passive range of motion;Energy conservation    PT Next Visit Plan  progress balance intervention with compliant surfaces and dynamic balance; airex tandem stance    Consulted and Agree with Plan of Care  Patient       Patient will benefit from skilled therapeutic intervention in order to improve the following deficits and impairments:  Abnormal gait, Decreased activity tolerance, Decreased balance, Decreased endurance, Decreased mobility, Difficulty walking, Decreased strength, Improper body mechanics, Decreased range of motion  Visit Diagnosis:  Other abnormalities of gait and mobility  Muscle weakness (generalized)  Unsteadiness on feet     Problem List Patient Active Problem List   Diagnosis Date Noted  . 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 Plantar fasciitis, bilateral 02/23/2019  . Dermatitis 02/23/2019    Alanson Puls, PT DPT 04/19/2019, 9:05 AM  [place] MAIN [facility] SERVICES [address] [place],  [place], [address] Phone: [phone]   Fax:  [phone]  Name: Melody Cirrincione MRN: [id number] Date of Birth: 03/06/1946

## 2019-04-24 ENCOUNTER — Encounter: Payer: Self-pay | Admitting: Physical Therapy

## 2019-04-24 ENCOUNTER — Other Ambulatory Visit: Payer: Self-pay

## 2019-04-24 ENCOUNTER — Ambulatory Visit: Payer: Medicare HMO | Admitting: Physical Therapy

## 2019-04-24 DIAGNOSIS — R2689 Other abnormalities of gait and mobility: Secondary | ICD-10-CM | POA: Diagnosis not present

## 2019-04-24 DIAGNOSIS — R2681 Unsteadiness on feet: Secondary | ICD-10-CM

## 2019-04-24 DIAGNOSIS — M6281 Muscle weakness (generalized): Secondary | ICD-10-CM

## 2019-04-24 NOTE — Therapy (Signed)
[place] MAIN [facility] SERVICES [address] [place], [place], [address] Phone: [phone]   Fax:  [phone]  Physical Therapy Treatment  Patient Details  Name: Patricia Hahn MRN: [id number] Date of Birth: 31-Mar-1946 Referring Provider (PT): Lamonte Sakai   Encounter Date: 04/24/2019  PT End of Session - 04/24/19 0847    Visit Number  11    Number of Visits  17    Date for PT Re-Evaluation  05/08/19    PT Start Time  0945    PT Stop Time  1025    PT Time Calculation (min)  40 min    Equipment Utilized During Treatment  Gait belt    Activity Tolerance  Patient tolerated treatment well;No increased pain    Behavior During Therapy  [facility] for tasks assessed/performed       History reviewed. No pertinent past medical history.  Past Surgical History:  Procedure Laterality Date  . ABDOMINAL HYSTERECTOMY    . APPENDECTOMY    . CERVICAL FUSION    . COLONOSCOPY N/A 10/19/2014   Procedure: COLONOSCOPY;  Surgeon: Hulen Luster, MD;  Location: [facility] ENDOSCOPY;  Service: Gastroenterology;  Laterality: N/A;  . ORIF ANKLE FRACTURE    . SHOULDER ACROMIOPLASTY      There were no vitals filed for this visit.  Subjective Assessment - 04/24/19 0846    Subjective  Patient is doing well today,    Pertinent History  Patient is a 73 y.o. female with Hx of R ankle fracture in 2004 after falling off ladder, bilateral plantar fasciitis, shoulder acromioplasty. Is a current smoker of 2 cigarettes/day. Patient received physical therapy in 2017 for balance, which patient claimed resulted in significant improvements. She has c/o frequent LOB while standing, walking, bending over, and lifting, with 2 reported falls in the past 6 months.    Limitations  Lifting;Standing;Walking    How long can you sit comfortably?  N/A; can sit all day.    How long can you stand comfortably?  Able to stand 4 hour shifts at work    How long can you walk comfortably?  Able to work 4  hour shift without stops, uses shopping cart for assistance.    Patient Stated Goals  improve walking and balance    Currently in Pain?  No/denies    Pain Score  0-No pain    Pain Onset  In the past 7 days       Treatment: Nu-step x 5 mins Pro-stretch standing 30 sec x 3  Neuromuscular Re-education   Tandem gait on 2"x4" without UE support x 2 lengths Side stepping on 2"x4" without UE support x 2 lengths Heel/toe raises without UE support 3s hold x 10 each 1/2 foam roll balance with flat side up 30s x 2 reps 1/2 foam roll balance with flat side down 30s x 2 reps 1/2 foam roll tandem balance alternating forward LE 30s x 2 each LE forward Matrix 7.5 lbs fwd/bwd, side to side x 3 reps       Pt educated throughout session about proper posture and technique with exercises. Improved exercise technique, movement at target joints, use of target muscles after min to mod verbal, visual, tactile cues. CGA and Min to mod verbal cues used throughout with increased in postural sway and LOB most seen with narrow base of support and while on uneven surfaces. Continues to have balance deficits typical with diagnosis. Patient performs intermediate level exercises without pain behaviors and needs  verbal cuing for postural alignment and head positioning Tactile cues and assistance needed to keep lower leg and knee in neutral to avoid compensations with ankle motions.                          PT Education - 04/24/19 0846    Education Details  HEP    Person(s) Educated  Patient    Methods  Explanation    Comprehension  Verbalized understanding;Returned demonstration;Need further instruction;Tactile cues required       PT Short Term Goals - 04/19/19 0853      PT SHORT TERM GOAL #1   Title  Patient will be independent in initial home exercise program to improve strength/mobility for better functional independence with ADLs    Baseline  Provided HEP 03/13/19    Time  4    Period   Weeks    Status  Achieved    Target Date  04/10/19      PT SHORT TERM GOAL #2   Title  Patient will increase normal walking speed 10 meter walk test to >1.3ms as to improve gait speed for better community ambulation and to reduce fall risk.    Baseline  03/13/19 14.13 m/s normal walking speed with frequent LOB.04/19/19=    Time  4    Period  Weeks    Status  Partially Met    Target Date  04/10/19        PT Long Term Goals - 04/19/19 0854      PT LONG TERM GOAL #1   Title  Patient will be independent in finalized home exercise program to improve strength/mobility for better functional independence with ADLs    Time  8    Period  Weeks    Status  On-going    Target Date  05/08/19      PT LONG TERM GOAL #2   Title  Patient will ascend/descend 4 stairs without rail assist independently without loss of balance to improve ability to get in/out of home.    Baseline  03/13/19 Patient states she uses 1-2 HHA to climb 6 stairs to enter/exit home.    Time  8    Period  Weeks    Status  On-going    Target Date  05/08/19      PT LONG TERM GOAL #3   Title  Patient will increase Berg Balance score by > 6 points to demonstrate decreased fall risk during functional activities.    Baseline  03/13/19 46/56 BBT.04/19/19=49/56    Time  8    Period  Weeks    Status  Partially Met    Target Date  05/08/19      PT LONG TERM GOAL #4   Title  Patient will be independent in bending down towards floor and picking up small object (<5 pounds) and then stand back up without loss of balance as to improve ability to pick up and clean up room at home and perform vocational duties.    Baseline  03/13/19 LOB when bending over and lifting.    Time  8    Period  Weeks    Status  Partially Met    Target Date  05/08/19      PT LONG TERM GOAL #5   Title  Pt will increase gross LE strength to 5/5 for improved ability to complete ADLs and IADLs.    Baseline  03/13/19 R>L weakness, ankle DF, hip ext,  hip  flex, hip abd from 3+ to 4+/5.    Time  8    Period  Weeks    Status  Partially Met    Target Date  05/08/19            Plan - 04/24/19 0848    Clinical Impression Statement  Patient instructed in intermediate balance and coordination exercise. Patient required mod VCs and min A for gait to improve weight shift and postural control. Patient requires min VCs to improve ankle stability with gait.  Patients would benefit from additional skilled PT intervention to improve balance/gait safety and reduce fall risk.   Personal Factors and Comorbidities  Age;Fitness;Profession    Examination-Activity Limitations  Bend;Carry;Lift;Locomotion Level;Reach Overhead;Squat;Stairs;Stand;Transfers    Examination-Participation Restrictions  Yard Work;Shop    Stability/Clinical Decision Making  Stable/Uncomplicated    Rehab Potential  Excellent    PT Frequency  2x / week    PT Duration  8 weeks    PT Treatment/Interventions  ADLs/Self Care Home Management;Aquatic Therapy;Cryotherapy;Electrical Stimulation;Moist Heat;Ultrasound;Gait training;Stair training;Functional mobility training;Therapeutic activities;Therapeutic exercise;Balance training;Neuromuscular re-education;Patient/family education;Orthotic Fit/Training;Manual techniques;Passive range of motion;Energy conservation    PT Next Visit Plan  progress balance intervention with compliant surfaces and dynamic balance; airex tandem stance    Consulted and Agree with Plan of Care  Patient       Patient will benefit from skilled therapeutic intervention in order to improve the following deficits and impairments:  Abnormal gait, Decreased activity tolerance, Decreased balance, Decreased endurance, Decreased mobility, Difficulty walking, Decreased strength, Improper body mechanics, Decreased range of motion  Visit Diagnosis: Other abnormalities of gait and mobility  Muscle weakness (generalized)  Unsteadiness on feet     Problem List Patient  Active Problem List   Diagnosis Date Noted  . 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 Plantar fasciitis, bilateral 02/23/2019  . Dermatitis 02/23/2019    Alanson Puls, PT DPT 04/24/2019, 9:21 AM  [place] MAIN [facility] SERVICES [address] [place], [place], [address] Phone: [phone]   Fax:  [phone]  Name: Patricia Hahn MRN: [id number] Date of Birth: September 26, 1945

## 2019-04-26 ENCOUNTER — Other Ambulatory Visit: Payer: Self-pay

## 2019-04-26 ENCOUNTER — Ambulatory Visit: Payer: Medicare HMO

## 2019-04-26 DIAGNOSIS — R2689 Other abnormalities of gait and mobility: Secondary | ICD-10-CM | POA: Diagnosis not present

## 2019-04-26 DIAGNOSIS — R2681 Unsteadiness on feet: Secondary | ICD-10-CM

## 2019-04-26 DIAGNOSIS — M6281 Muscle weakness (generalized): Secondary | ICD-10-CM

## 2019-04-26 NOTE — Therapy (Signed)
[place] MAIN [facility] SERVICES [address] [place], [place], [address] Phone: [phone]   Fax:  [phone]  Physical Therapy Treatment  Patient Details  Name: Patricia Hahn MRN: [id number] Date of Birth: 18-Jul-1945 Referring Provider (PT): Lamonte Sakai   Encounter Date: 04/26/2019  PT End of Session - 04/26/19 0844    Visit Number  12    Number of Visits  17    Date for PT Re-Evaluation  05/08/19    PT Start Time  0839    PT Stop Time  0920    PT Time Calculation (min)  41 min    Equipment Utilized During Treatment  Gait belt    Activity Tolerance  Patient tolerated treatment well;No increased pain    Behavior During Therapy  [facility] for tasks assessed/performed       No past medical history on file.  Past Surgical History:  Procedure Laterality Date  . ABDOMINAL HYSTERECTOMY    . APPENDECTOMY    . CERVICAL FUSION    . COLONOSCOPY N/A 10/19/2014   Procedure: COLONOSCOPY;  Surgeon: Hulen Luster, MD;  Location: [facility] ENDOSCOPY;  Service: Gastroenterology;  Laterality: N/A;  . ORIF ANKLE FRACTURE    . SHOULDER ACROMIOPLASTY      There were no vitals filed for this visit.  Subjective Assessment - 04/26/19 0843    Subjective  Pt doing ok today, just got off from work. 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    Pertinent History  Patient is a 73 y.o. female with Hx of R ankle fracture in 2004 after falling off ladder, bilateral plantar fasciitis, shoulder acromioplasty. Is a current smoker of 2 cigarettes/day. Patient received physical therapy in 2017 for balance, which patient claimed resulted in significant improvements. She has c/o frequent LOB while standing, walking, bending over, and lifting, with 2 reported falls in the past 6 months.    Currently in Pain?  No/denies       INTERVENTION THIS DATE:  Therapeutic Exercise: Nu-step x 4 mins, seat 8, arms 10, level 2 Double Pro-stretch calf  stretch standing 30 sec x 3, // bars as needed Heel raises 2x15 Ankle Dorsiflexion 2x15    Neuromuscular Re-education: Tandem gaiton 2"x4" intermittent UE support x4lengths Side stepping on 2"x4" intermittent UE support x 4 lengths 1/2 foam roll balance with flat side up 30sx 3 reps 1/2 foam roll balance with flat side down 30sx 3 reps 1/2 foam roll tandem balance alternating forward LE 30s x 2 eachLE forward Narrow airex stance trunk/head rotation 1x10 bilat Narrow stance airex overhead doorframe taps with rainbox ball 1x20  Narrow stance airex overdoorframe rebounding x20 Lateral stepups 4" step 1x10 bilat    PT Short Term Goals - 04/19/19 0853      PT SHORT TERM GOAL #1   Title  Patient will be independent in initial home exercise program to improve strength/mobility for better functional independence with ADLs    Baseline  Provided HEP 03/13/19    Time  4    Period  Weeks    Status  Achieved    Target Date  04/10/19      PT SHORT TERM GOAL #2   Title  Patient will increase normal walking speed 10 meter walk test to >1.69ms as to improve gait speed for better community ambulation and to reduce fall risk.    Baseline  03/13/19 14.13 m/s normal walking speed with frequent  LOB.04/19/19=    Time  4    Period  Weeks    Status  Partially Met    Target Date  04/10/19        PT Long Term Goals - 04/19/19 0854      PT LONG TERM GOAL #1   Title  Patient will be independent in finalized home exercise program to improve strength/mobility for better functional independence with ADLs    Time  8    Period  Weeks    Status  On-going    Target Date  05/08/19      PT LONG TERM GOAL #2   Title  Patient will ascend/descend 4 stairs without rail assist independently without loss of balance to improve ability to get in/out of home.    Baseline  03/13/19 Patient states she uses 1-2 HHA to climb 6 stairs to enter/exit home.    Time  8    Period  Weeks    Status  On-going     Target Date  05/08/19      PT LONG TERM GOAL #3   Title  Patient will increase Berg Balance score by > 6 points to demonstrate decreased fall risk during functional activities.    Baseline  03/13/19 46/56 BBT.04/19/19=49/56    Time  8    Period  Weeks    Status  Partially Met    Target Date  05/08/19      PT LONG TERM GOAL #4   Title  Patient will be independent in bending down towards floor and picking up small object (<5 pounds) and then stand back up without loss of balance as to improve ability to pick up and clean up room at home and perform vocational duties.    Baseline  03/13/19 LOB when bending over and lifting.    Time  8    Period  Weeks    Status  Partially Met    Target Date  05/08/19      PT LONG TERM GOAL #5   Title  Pt will increase gross LE strength to 5/5 for improved ability to complete ADLs and IADLs.    Baseline  03/13/19 R>L weakness, ankle DF, hip ext, hip flex, hip abd from 3+ to 4+/5.    Time  8    Period  Weeks    Status  Partially Met    Target Date  05/08/19            Plan - 04/26/19 0846    Clinical Impression Statement  Pt able to complete entire session as planned with rest breaks provided as needed. Pt maintains high level of focus and motivation. Extensive verbal, visual, and tactile cues are provided for most accurate form possible. Overall pt continues to make steady progress toward treatment goals. Pt generally able to perform most activities at modified independent level with intermittent support on // bars. Pt appears to perform better this date with tandem balance beam AMB than last session, encouraged to move slowly and take her time rather than move too quickly and lose control.    Personal Factors and Comorbidities  Age;Fitness;Profession    Examination-Activity Limitations  Bend;Carry;Lift;Locomotion Level;Reach Overhead;Squat;Stairs;Stand;Transfers    Examination-Participation Restrictions  Yard Work;Shop    Stability/Clinical  Decision Making  Stable/Uncomplicated    Clinical Decision Making  Low    Rehab Potential  Excellent    PT Frequency  2x / week    PT Duration  8 weeks    PT  Treatment/Interventions  ADLs/Self Care Home Management;Aquatic Therapy;Cryotherapy;Electrical Stimulation;Moist Heat;Ultrasound;Gait training;Stair training;Functional mobility training;Therapeutic activities;Therapeutic exercise;Balance training;Neuromuscular re-education;Patient/family education;Orthotic Fit/Training;Manual techniques;Passive range of motion;Energy conservation    PT Next Visit Plan  progress balance intervention with compliant surfaces and dynamic balance; airex tandem stance    PT Home Exercise Plan  no updates today    Consulted and Agree with Plan of Care  Patient       Patient will benefit from skilled therapeutic intervention in order to improve the following deficits and impairments:  Abnormal gait, Decreased activity tolerance, Decreased balance, Decreased endurance, Decreased mobility, Difficulty walking, Decreased strength, Improper body mechanics, Decreased range of motion  Visit Diagnosis: Other abnormalities of gait and mobility  Muscle weakness (generalized)  Unsteadiness on feet     Problem List Patient Active Problem List   Diagnosis Date Noted  . 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 Plantar fasciitis, bilateral 02/23/2019  . Dermatitis 02/23/2019   9:26 AM, 04/26/19 Etta Grandchild, PT, DPT Physical Therapist - [place] [phone]     Etta Grandchild 04/26/2019, 8:51 AM  [place] MAIN [facility] SERVICES [address] [place], [place], [address] Phone: [phone]   Fax:  [phone]  Name: Kylea Berrong MRN: [id number] Date of Birth: 26-Apr-1946

## 2019-05-01 ENCOUNTER — Ambulatory Visit: Payer: Medicare HMO

## 2019-05-01 ENCOUNTER — Other Ambulatory Visit: Payer: Self-pay

## 2019-05-01 DIAGNOSIS — R2689 Other abnormalities of gait and mobility: Secondary | ICD-10-CM | POA: Diagnosis not present

## 2019-05-01 DIAGNOSIS — M6281 Muscle weakness (generalized): Secondary | ICD-10-CM

## 2019-05-01 DIAGNOSIS — R2681 Unsteadiness on feet: Secondary | ICD-10-CM

## 2019-05-01 NOTE — Therapy (Signed)
[place] MAIN [facility] SERVICES [address] [place], [place], [address] Phone: [phone]   Fax:  [phone]  Physical Therapy Treatment  Patient Details  Name: Christella App MRN: [id number] Date of Birth: 05/06/1946 Referring Provider (PT): Lamonte Sakai   Encounter Date: 05/01/2019  PT End of Session - 05/01/19 0812    Visit Number  13    Number of Visits  17    Date for PT Re-Evaluation  05/08/19    PT Start Time  0802    PT Stop Time  0842    PT Time Calculation (min)  40 min    Equipment Utilized During Treatment  Gait belt    Activity Tolerance  Patient tolerated treatment well;No increased pain    Behavior During Therapy  [facility] for tasks assessed/performed       No past medical history on file.  Past Surgical History:  Procedure Laterality Date  . ABDOMINAL HYSTERECTOMY    . APPENDECTOMY    . CERVICAL FUSION    . COLONOSCOPY N/A 10/19/2014   Procedure: COLONOSCOPY;  Surgeon: Hulen Luster, MD;  Location: [facility] ENDOSCOPY;  Service: Gastroenterology;  Laterality: N/A;  . ORIF ANKLE FRACTURE    . SHOULDER ACROMIOPLASTY      There were no vitals filed for this visit.  Subjective Assessment - 05/01/19 0810    Subjective  Pt doing well this date, reports a busy weekend. She worked on Friuday and Sat.    Pertinent History  Patient is a 73 y.o. female with Hx of R ankle fracture in 2004 after falling off ladder, bilateral plantar fasciitis, shoulder acromioplasty. Is a current smoker of 2 cigarettes/day. Patient received physical therapy in 2017 for balance, which patient claimed resulted in significant improvements. She has c/o frequent LOB while standing, walking, bending over, and lifting, with 2 reported falls in the past 6 months.    Currently in Pain?  No/denies        INTERVENTION THIS DATE: Therapeutic Exercise -3 minutes continuous AMB overground, avg 0.67ms); increased stance time on Left, decreased toe clearance bilat  with RLE vaulting.  -stand calf stretch 2x30sec bilat  -Standing heel raises 2x15 (BUE support)  -Standing ankle dorsiflexion 2x15 (BUE support)   Neuromuscular Re-education: -Tandem gaiton 2"x4" intermittent UE support x6lengths -Side stepping on 2"x4" intermittent UE support x 6 lengths -1/2 foam roll balance with flat side up 30sx 2 reps -1/2 foam roll balance with flat side down 30sx 2 reps -1/2 foam roll tandem balance alternating forward LE 30s x 2 eachLE forward -Narrow airex stance trunk/head rotation 1x10 bilat -Lateral stepups 6" step 1x10 bilat -lateral hurdle step overs 1x16 -marching in place, high knees 1x10       PT Short Term Goals - 04/19/19 0853      PT SHORT TERM GOAL #1   Title  Patient will be independent in initial home exercise program to improve strength/mobility for better functional independence with ADLs    Baseline  Provided HEP 03/13/19    Time  4    Period  Weeks    Status  Achieved    Target Date  04/10/19      PT SHORT TERM GOAL #2   Title  Patient will increase normal walking speed 10 meter walk test to >1.063m as to improve gait speed for better community ambulation and to reduce fall risk.    Baseline  03/13/19 14.13 m/s normal walking speed with frequent LOB.04/19/19=  Time  4    Period  Weeks    Status  Partially Met    Target Date  04/10/19        PT Long Term Goals - 04/19/19 0854      PT LONG TERM GOAL #1   Title  Patient will be independent in finalized home exercise program to improve strength/mobility for better functional independence with ADLs    Time  8    Period  Weeks    Status  On-going    Target Date  05/08/19      PT LONG TERM GOAL #2   Title  Patient will ascend/descend 4 stairs without rail assist independently without loss of balance to improve ability to get in/out of home.    Baseline  03/13/19 Patient states she uses 1-2 HHA to climb 6 stairs to enter/exit home.    Time  8    Period  Weeks     Status  On-going    Target Date  05/08/19      PT LONG TERM GOAL #3   Title  Patient will increase Berg Balance score by > 6 points to demonstrate decreased fall risk during functional activities.    Baseline  03/13/19 46/56 BBT.04/19/19=49/56    Time  8    Period  Weeks    Status  Partially Met    Target Date  05/08/19      PT LONG TERM GOAL #4   Title  Patient will be independent in bending down towards floor and picking up small object (<5 pounds) and then stand back up without loss of balance as to improve ability to pick up and clean up room at home and perform vocational duties.    Baseline  03/13/19 LOB when bending over and lifting.    Time  8    Period  Weeks    Status  Partially Met    Target Date  05/08/19      PT LONG TERM GOAL #5   Title  Pt will increase gross LE strength to 5/5 for improved ability to complete ADLs and IADLs.    Baseline  03/13/19 R>L weakness, ankle DF, hip ext, hip flex, hip abd from 3+ to 4+/5.    Time  8    Period  Weeks    Status  Partially Met    Target Date  05/08/19            Plan - 05/01/19 0813    Clinical Impression Statement  In general, patient demonstrating good tolerance to therapy session this date, reasonable accommodations are alllowed in-session to allow adequate rest between activities as needed. All interventional executed without any exacerbation of pain or other symptoms.  Began session with AMB overground, which provides more clear picture of deficits that balance interventions. Pt does fatigue quickly, endorses AMB longer distances at work with shopping buggy. Pt given intermittent multimodal cues to teach best possible form with exercises. Pt continues to make steady progress toward most goals. No home exercise updates made at this time.    PT Frequency  2x / week    PT Duration  8 weeks    PT Treatment/Interventions  ADLs/Self Care Home Management;Aquatic Therapy;Cryotherapy;Electrical Stimulation;Moist  Heat;Ultrasound;Gait training;Stair training;Functional mobility training;Therapeutic activities;Therapeutic exercise;Balance training;Neuromuscular re-education;Patient/family education;Orthotic Fit/Training;Manual techniques;Passive range of motion;Energy conservation    PT Next Visit Plan  progress balance intervention with compliant surfaces and dynamic balance; airex tandem stance    PT Home Exercise Plan  no  updates today    Consulted and Agree with Plan of Care  Patient       Patient will benefit from skilled therapeutic intervention in order to improve the following deficits and impairments:  Abnormal gait, Decreased activity tolerance, Decreased balance, Decreased endurance, Decreased mobility, Difficulty walking, Decreased strength, Improper body mechanics, Decreased range of motion  Visit Diagnosis: Other abnormalities of gait and mobility  Muscle weakness (generalized)  Unsteadiness on feet     Problem List Patient Active Problem List   Diagnosis Date Noted  . 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 Plantar fasciitis, bilateral 02/23/2019  . Dermatitis 02/23/2019  8:27 AM, 05/01/19 Etta Grandchild, PT, DPT Physical Therapist - [facility]  Outpatient Physical [place] [phone]    Etta Grandchild 05/01/2019, 8:15 AM  [place] MAIN [facility] SERVICES [address] [place], [place], [address] Phone: [phone]   Fax:  [phone]  Name: Drina Jobst MRN: [id number] Date of Birth: 1946-02-16

## 2019-05-03 ENCOUNTER — Other Ambulatory Visit: Payer: Self-pay

## 2019-05-03 ENCOUNTER — Ambulatory Visit: Payer: Medicare HMO | Attending: Neurology | Admitting: Physical Therapy

## 2019-05-03 ENCOUNTER — Encounter: Payer: Self-pay | Admitting: Physical Therapy

## 2019-05-03 DIAGNOSIS — M6281 Muscle weakness (generalized): Secondary | ICD-10-CM | POA: Insufficient documentation

## 2019-05-03 DIAGNOSIS — R2681 Unsteadiness on feet: Secondary | ICD-10-CM | POA: Insufficient documentation

## 2019-05-03 DIAGNOSIS — R2689 Other abnormalities of gait and mobility: Secondary | ICD-10-CM | POA: Diagnosis present

## 2019-05-03 NOTE — Therapy (Signed)
[place] MAIN [facility] SERVICES [address] [place], [place], [address] Phone: [phone]   Fax:  [phone]  Physical Therapy Treatment  Patient Details  Name: Patricia Hahn MRN: [id number] Date of Birth: [birth year] Referring Provider (PT): Lamonte Sakai   Encounter Date: 05/03/2019  PT End of Session - 05/03/19 0836    Visit Number  14    Number of Visits  17    Date for PT Re-Evaluation  05/08/19    PT Start Time  0845    PT Stop Time  0925    PT Time Calculation (min)  40 min    Equipment Utilized During Treatment  Gait belt    Activity Tolerance  Patient tolerated treatment well;No increased pain    Behavior During Therapy  [facility] for tasks assessed/performed       History reviewed. No pertinent past medical history.  Past Surgical History:  Procedure Laterality Date  . ABDOMINAL HYSTERECTOMY    . APPENDECTOMY    . CERVICAL FUSION    . COLONOSCOPY N/A 10/19/2014   Procedure: COLONOSCOPY;  Surgeon: Hulen Luster, MD;  Location: [facility] ENDOSCOPY;  Service: Gastroenterology;  Laterality: N/A;  . ORIF ANKLE FRACTURE    . SHOULDER ACROMIOPLASTY      There were no vitals filed for this visit.       Octane fitness x 5 mins L4  Leg press 100 lbs x 20 x 3  Leg press 45lbs , heel raises x 20 x 3  Neuromuscular Training: Stool: staggered stance, head turns side/side, up/down x 5 reps each, each foot in front; VCs for proper technique and positioning for each exercise staggered stance, trunk rotation with 2 lb rod, VC to keep UE straight and turn head with trunk  Rockerboard Lateral weight shift x10 reps each direction, no UE support, CGA for safety with VCs to tap in each direction, controlling the speed Anterior/Posterior weight shift x10 reps, no UE support, CGA for safety with VCs to utilize ankles to shift weight over toes and back to heels  Hurdle: Forward and backward stepping over hurdle x15 each direction, VCs to take  big enough steps and to try to increase speed to work on coordination  Side stepping over hurdle 15x each direction  Blue Foam: Side stepping x10 on blue balance CGA for safety, VCs for taking a big enough step  Airex pad trunk rotation x2 min, CGA for safety, demonstrated difficulty with keeping arms extended and full rotation with head turn Airex pad, balloon tapping to mirror x2 min, supervision for safety with varying directions and speed of balloon, VCs for utilizing both hands and minimizing UE support  BOSU: Lunge to BOSU ball x 10 , cues for going slow and to control the speed  Floor Star exercise: Performed stepping on the star diagram on the floor. Working on weight-shifting forward onto the foot that stepped forward and then weight-shifting back and bringing her feet back together with CGA. Performed 2 reps each foot.   Matrix: Fwd/bwd gait with 22. 5 lbs and CGA, cues for posture and stepping strategies, occasional LOB Side stepping left and right and CGA with cues to slow movement    Pt educated throughout session about proper posture and technique with exercises. Improved exercise technique, movement at target joints, use of target muscles after min to mod verbal, visual, tactile cues. CGA and Min to mod verbal cues used throughout with increased in postural sway and LOB most seen  with narrow base of support and while on uneven surfaces. Continues to have balance deficits typical with diagnosis. Patient performs intermediate level exercises without pain behaviors and needs verbal cuing for postural alignment and head positioning                    PT Education - 05/03/19 0835    Education Details  HEP    Person(s) Educated  Patient    Methods  Explanation;Demonstration;Tactile cues;Verbal cues    Comprehension  Verbalized understanding;Returned demonstration;Verbal cues required;Tactile cues required;Need further instruction       PT Short Term Goals -  04/19/19 0853      PT SHORT TERM GOAL #1   Title  Patient will be independent in initial home exercise program to improve strength/mobility for better functional independence with ADLs    Baseline  Provided HEP 03/13/19    Time  4    Period  Weeks    Status  Achieved    Target Date  04/10/19      PT SHORT TERM GOAL #2   Title  Patient will increase normal walking speed 10 meter walk test to >1.63ms as to improve gait speed for better community ambulation and to reduce fall risk.    Baseline  03/13/19 14.13 m/s normal walking speed with frequent LOB.04/19/19=    Time  4    Period  Weeks    Status  Partially Met    Target Date  04/10/19        PT Long Term Goals - 04/19/19 0854      PT LONG TERM GOAL #1   Title  Patient will be independent in finalized home exercise program to improve strength/mobility for better functional independence with ADLs    Time  8    Period  Weeks    Status  On-going    Target Date  05/08/19      PT LONG TERM GOAL #2   Title  Patient will ascend/descend 4 stairs without rail assist independently without loss of balance to improve ability to get in/out of home.    Baseline  03/13/19 Patient states she uses 1-2 HHA to climb 6 stairs to enter/exit home.    Time  8    Period  Weeks    Status  On-going    Target Date  05/08/19      PT LONG TERM GOAL #3   Title  Patient will increase Berg Balance score by > 6 points to demonstrate decreased fall risk during functional activities.    Baseline  03/13/19 46/56 BBT.04/19/19=49/56    Time  8    Period  Weeks    Status  Partially Met    Target Date  05/08/19      PT LONG TERM GOAL #4   Title  Patient will be independent in bending down towards floor and picking up small object (<5 pounds) and then stand back up without loss of balance as to improve ability to pick up and clean up room at home and perform vocational duties.    Baseline  03/13/19 LOB when bending over and lifting.    Time  8    Period   Weeks    Status  Partially Met    Target Date  05/08/19      PT LONG TERM GOAL #5   Title  Pt will increase gross LE strength to 5/5 for improved ability to complete ADLs and IADLs.    Baseline  03/13/19 R>L weakness, ankle DF, hip ext, hip flex, hip abd from 3+ to 4+/5.    Time  8    Period  Weeks    Status  Partially Met    Target Date  05/08/19            Plan - 05/03/19 0836    Clinical Impression Statement  p    PT Frequency  2x / week    PT Duration  8 weeks    PT Treatment/Interventions  ADLs/Self Care Home Management;Aquatic Therapy;Cryotherapy;Electrical Stimulation;Moist Heat;Ultrasound;Gait training;Stair training;Functional mobility training;Therapeutic activities;Therapeutic exercise;Balance training;Neuromuscular re-education;Patient/family education;Orthotic Fit/Training;Manual techniques;Passive range of motion;Energy conservation    PT Next Visit Plan  progress balance intervention with compliant surfaces and dynamic balance; airex tandem stance    PT Home Exercise Plan  no updates today    Consulted and Agree with Plan of Care  Patient       Patient will benefit from skilled therapeutic intervention in order to improve the following deficits and impairments:  Abnormal gait, Decreased activity tolerance, Decreased balance, Decreased endurance, Decreased mobility, Difficulty walking, Decreased strength, Improper body mechanics, Decreased range of motion  Visit Diagnosis: Other abnormalities of gait and mobility  Muscle weakness (generalized)  Unsteadiness on feet     Problem List Patient Active Problem List   Diagnosis Date Noted  . 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 Plantar fasciitis, bilateral 02/23/2019  . Dermatitis 02/23/2019    Alanson Puls 05/03/2019, 8:37 AM  [place] MAIN [facility] SERVICES [address] [place], [place], [address] Phone: [phone]   Fax:  [phone]  Name: Patricia Hahn MRN: [id number] Date of Birth: 10-17-1945

## 2019-05-08 ENCOUNTER — Encounter: Payer: Self-pay | Admitting: Physical Therapy

## 2019-05-08 ENCOUNTER — Ambulatory Visit: Payer: Medicare HMO | Admitting: Physical Therapy

## 2019-05-08 ENCOUNTER — Other Ambulatory Visit: Payer: Self-pay

## 2019-05-08 DIAGNOSIS — R2689 Other abnormalities of gait and mobility: Secondary | ICD-10-CM | POA: Diagnosis not present

## 2019-05-08 DIAGNOSIS — M6281 Muscle weakness (generalized): Secondary | ICD-10-CM

## 2019-05-08 DIAGNOSIS — R2681 Unsteadiness on feet: Secondary | ICD-10-CM

## 2019-05-08 NOTE — Therapy (Signed)
[place] MAIN [facility] SERVICES [address] [place], [place], [address] Phone: [phone]   Fax:  [phone]  Physical Therapy Treatment  Patient Details  Name: Patricia Hahn MRN: [id number] Date of Birth: 1946-03-25 Referring Provider (PT): Lamonte Sakai   Encounter Date: 05/08/2019  PT End of Session - 05/08/19 0802    Visit Number  15    Number of Visits  17    Date for PT Re-Evaluation  05/08/19    PT Start Time  0800    PT Stop Time  0840    PT Time Calculation (min)  40 min    Equipment Utilized During Treatment  Gait belt    Activity Tolerance  Patient tolerated treatment well;No increased pain    Behavior During Therapy  [facility] for tasks assessed/performed       History reviewed. No pertinent past medical history.  Past Surgical History:  Procedure Laterality Date  . ABDOMINAL HYSTERECTOMY    . APPENDECTOMY    . CERVICAL FUSION    . COLONOSCOPY N/A 10/19/2014   Procedure: COLONOSCOPY;  Surgeon: Hulen Luster, MD;  Location: [facility] ENDOSCOPY;  Service: Gastroenterology;  Laterality: N/A;  . ORIF ANKLE FRACTURE    . SHOULDER ACROMIOPLASTY      There were no vitals filed for this visit.  Subjective Assessment - 05/08/19 0801    Subjective  Pt doing well this date, reports a busy weekend.    Pertinent History  Patient is a 73 y.o. female with Hx of R ankle fracture in 2004 after falling off ladder, bilateral plantar fasciitis, shoulder acromioplasty. Is a current smoker of 2 cigarettes/day. Patient received physical therapy in 2017 for balance, which patient claimed resulted in significant improvements. She has c/o frequent LOB while standing, walking, bending over, and lifting, with 2 reported falls in the past 6 months.    Limitations  Lifting;Standing;Walking    How long can you sit comfortably?  N/A; can sit all day.    How long can you stand comfortably?  Able to stand 4 hour shifts at work    How long can you walk  comfortably?  Able to work 4 hour shift without stops, uses shopping cart for assistance.    Patient Stated Goals  improve walking and balance    Currently in Pain?  No/denies    Pain Score  0-No pain    Pain Onset  In the past 7 days         Neuromuscular Re-education  Rocker board fwd/bwd, side to side x 20 each direction Tandem gait on 2"x4" without UE support x 2 lengths Side stepping on 2"x4" without UE support x 2 lengths Heel/toe raises without UE support 3s hold x 10 each 1/2 foam roll balance with flat side up 30s x 2 reps 1/2 foam roll balance with flat side up tandem standing x 2 mins 1/2 foam roll balance with flat side down 30s x 2 reps 1/2 foam roll tandem balance alternating forward LE 30s x 2 each LE forward Lateral side steps from foam to 6 inch stool left and right x 15 Backwards stepping from foam to 6 inch stool x 15  [place] fwd/bwd, side to side , diagonal x 10 ,cues for posture and stepping strategies, occasional LOB Star stepping left and right x 10       Pt educated throughout session about proper posture and technique with exercises. Improved exercise technique, movement at target joints, use of  target muscles after min to mod verbal, visual, tactile cues. CGA and Min to mod verbal cues used throughout with increased in postural sway and LOB most seen with narrow base of support and while on uneven surfaces. Continues to have balance deficits typical with diagnosis. Patient performs intermediate level exercises without pain behaviors and needs verbal cuing for postural alignment and head positioning                      PT Education - 05/08/19 0802    Education Details  HEP    Person(s) Educated  Patient    Methods  Explanation;Demonstration;Tactile cues;Verbal cues    Comprehension  Verbalized understanding;Returned demonstration;Verbal cues required;Tactile cues required;Need further instruction       PT Short Term Goals -  04/19/19 0853      PT SHORT TERM GOAL #1   Title  Patient will be independent in initial home exercise program to improve strength/mobility for better functional independence with ADLs    Baseline  Provided HEP 03/13/19    Time  4    Period  Weeks    Status  Achieved    Target Date  04/10/19      PT SHORT TERM GOAL #2   Title  Patient will increase normal walking speed 10 meter walk test to >1.48ms as to improve gait speed for better community ambulation and to reduce fall risk.    Baseline  03/13/19 14.13 m/s normal walking speed with frequent LOB.04/19/19=    Time  4    Period  Weeks    Status  Partially Met    Target Date  04/10/19        PT Long Term Goals - 04/19/19 0854      PT LONG TERM GOAL #1   Title  Patient will be independent in finalized home exercise program to improve strength/mobility for better functional independence with ADLs    Time  8    Period  Weeks    Status  On-going    Target Date  05/08/19      PT LONG TERM GOAL #2   Title  Patient will ascend/descend 4 stairs without rail assist independently without loss of balance to improve ability to get in/out of home.    Baseline  03/13/19 Patient states she uses 1-2 HHA to climb 6 stairs to enter/exit home.    Time  8    Period  Weeks    Status  On-going    Target Date  05/08/19      PT LONG TERM GOAL #3   Title  Patient will increase Berg Balance score by > 6 points to demonstrate decreased fall risk during functional activities.    Baseline  03/13/19 46/56 BBT.04/19/19=49/56    Time  8    Period  Weeks    Status  Partially Met    Target Date  05/08/19      PT LONG TERM GOAL #4   Title  Patient will be independent in bending down towards floor and picking up small object (<5 pounds) and then stand back up without loss of balance as to improve ability to pick up and clean up room at home and perform vocational duties.    Baseline  03/13/19 LOB when bending over and lifting.    Time  8    Period   Weeks    Status  Partially Met    Target Date  05/08/19  PT LONG TERM GOAL #5   Title  Pt will increase gross LE strength to 5/5 for improved ability to complete ADLs and IADLs.    Baseline  03/13/19 R>L weakness, ankle DF, hip ext, hip flex, hip abd from 3+ to 4+/5.    Time  8    Period  Weeks    Status  Partially Met    Target Date  05/08/19            Plan - 05/08/19 0803    Clinical Impression Statement  Patient instructed in advanced LE strengthening and intermediate balance exercise. Patient required min VCS to improve weight shift   for better stance control. Patient would benefit from additional skilled PT intervention to improve strength, balance and gait safety.   PT Frequency  2x / week    PT Duration  8 weeks    PT Treatment/Interventions  ADLs/Self Care Home Management;Aquatic Therapy;Cryotherapy;Electrical Stimulation;Moist Heat;Ultrasound;Gait training;Stair training;Functional mobility training;Therapeutic activities;Therapeutic exercise;Balance training;Neuromuscular re-education;Patient/family education;Orthotic Fit/Training;Manual techniques;Passive range of motion;Energy conservation    PT Next Visit Plan  progress balance intervention with compliant surfaces and dynamic balance; airex tandem stance    PT Home Exercise Plan  no updates today    Consulted and Agree with Plan of Care  Patient       Patient will benefit from skilled therapeutic intervention in order to improve the following deficits and impairments:  Abnormal gait, Decreased activity tolerance, Decreased balance, Decreased endurance, Decreased mobility, Difficulty walking, Decreased strength, Improper body mechanics, Decreased range of motion  Visit Diagnosis: Other abnormalities of gait and mobility  Muscle weakness (generalized)  Unsteadiness on feet     Problem List Patient Active Problem List   Diagnosis Date Noted  . 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 Plantar fasciitis, bilateral  02/23/2019  . Dermatitis 02/23/2019    Alanson Puls, PT DPT 05/08/2019, 8:03 AM  [place] MAIN [facility] SERVICES [address] [place], [place], [address] Phone: [phone]   Fax:  [phone]  Name: Patricia Hahn MRN: [id number] Date of Birth: 1945/10/06

## 2019-05-10 ENCOUNTER — Encounter: Payer: Self-pay | Admitting: Physical Therapy

## 2019-05-10 ENCOUNTER — Other Ambulatory Visit: Payer: Self-pay

## 2019-05-10 ENCOUNTER — Ambulatory Visit: Payer: Medicare HMO | Admitting: Physical Therapy

## 2019-05-10 DIAGNOSIS — R2689 Other abnormalities of gait and mobility: Secondary | ICD-10-CM | POA: Diagnosis not present

## 2019-05-10 DIAGNOSIS — R2681 Unsteadiness on feet: Secondary | ICD-10-CM

## 2019-05-10 DIAGNOSIS — M6281 Muscle weakness (generalized): Secondary | ICD-10-CM

## 2019-05-10 NOTE — Therapy (Signed)
[place] MAIN [facility] SERVICES [address] [place], [place], [address] Phone: [phone]   Fax:  [phone]  Physical Therapy Treatment  Patient Details  Name: Patricia Hahn MRN: [id number] Date of Birth: Nov 30, 1945 Referring Provider (PT): Lamonte Sakai   Encounter Date: 05/10/2019  PT End of Session - 05/10/19 0850    Visit Number  16    Number of Visits  17    Date for PT Re-Evaluation  05/08/19    PT Start Time  0845    PT Stop Time  0930    PT Time Calculation (min)  45 min    Equipment Utilized During Treatment  Gait belt    Activity Tolerance  Patient tolerated treatment well;No increased pain    Behavior During Therapy  [facility] for tasks assessed/performed       History reviewed. No pertinent past medical history.  Past Surgical History:  Procedure Laterality Date  . ABDOMINAL HYSTERECTOMY    . APPENDECTOMY    . CERVICAL FUSION    . COLONOSCOPY N/A 10/19/2014   Procedure: COLONOSCOPY;  Surgeon: Hulen Luster, MD;  Location: [facility] ENDOSCOPY;  Service: Gastroenterology;  Laterality: N/A;  . ORIF ANKLE FRACTURE    . SHOULDER ACROMIOPLASTY      There were no vitals filed for this visit.  Subjective Assessment - 05/10/19 0849    Subjective  Pt doing well this date, no new concerns.    Pertinent History  Patient is a 73 y.o. female with Hx of R ankle fracture in 2004 after falling off ladder, bilateral plantar fasciitis, shoulder acromioplasty. Is a current smoker of 2 cigarettes/day. Patient received physical therapy in 2017 for balance, which patient claimed resulted in significant improvements. She has c/o frequent LOB while standing, walking, bending over, and lifting, with 2 reported falls in the past 6 months.    Limitations  Lifting;Standing;Walking    How long can you sit comfortably?  N/A; can sit all day.    How long can you stand comfortably?  Able to stand 4 hour shifts at work    How long can you walk comfortably?  Able  to work 4 hour shift without stops, uses shopping cart for assistance.    Patient Stated Goals  improve walking and balance    Pain Onset  In the past 7 days       Treatment: Neuromuscular Training: Stool: staggered stance, head turns side/side, up/down x 5 reps each, each foot in front; VCs for proper technique and positioning for each exercise staggered stance, trunk rotation with 2 lb rod, VC to keep UE straight and turn head with trunk  Hurdle: Forward and backward stepping over hurdle x15 each direction, VCs to take big enough steps and to try to increase speed to work on coordination  Side stepping over hurdle 15x each direction  Blue Foam: Side stepping x10 on blue balance CGA for safety, VCs for taking a big enough step  Airex pad trunk rotation x2 min, CGA for safety, demonstrated difficulty with keeping arms extended and full rotation with head turn Airex pad, balloon tapping to mirror x2 min, supervision for safety with varying directions and speed of balloon, VCs for utilizing both hands and minimizing UE support  Matrix: Fwd/bwd gait with 22. 5 lbs and CGA, cues for posture and stepping strategies, occasional LOB Side stepping left and right and CGA with cues to slow movement  Gait out in hallway, CGA with VCs for maintaining gait  speed and step length Horizontal head turns x100 ft, naming cards while walking  Vertical head turns x100 ft Direction changes x100 ft, min difficulty with quick changes Speed changes x100 ft, min difficulty with slower gait speed and maintaining balance, mod difficulty with increased in gait speed  Dual task with alternating arm and leg lifts x 50  ft x2 laps, demonstrated increased difficulty with coordinating leg and arm lift and required increased time, demonstration, and mod VCs for proper sequencing   Pt educated throughout session about proper posture and technique with exercises. Improved exercise technique, movement at target joints, use  of target muscles after min to mod verbal, visual, tactile cues. CGA and Min to mod verbal cues used throughout with increased in postural sway and LOB most seen with narrow base of support and while on uneven surfaces. Continues to have balance deficits typical with diagnosis. Patient performs intermediate level exercises without pain behaviors and needs verbal cuing for postural alignment and head positioning                         PT Education - 05/10/19 0850    Education Details  HEP    Person(s) Educated  Patient    Methods  Explanation    Comprehension  Verbalized understanding;Returned demonstration;Verbal cues required;Tactile cues required       PT Short Term Goals - 04/19/19 0853      PT SHORT TERM GOAL #1   Title  Patient will be independent in initial home exercise program to improve strength/mobility for better functional independence with ADLs    Baseline  Provided HEP 03/13/19    Time  4    Period  Weeks    Status  Achieved    Target Date  04/10/19      PT SHORT TERM GOAL #2   Title  Patient will increase normal walking speed 10 meter walk test to >1.55ms as to improve gait speed for better community ambulation and to reduce fall risk.    Baseline  03/13/19 14.13 m/s normal walking speed with frequent LOB.04/19/19=    Time  4    Period  Weeks    Status  Partially Met    Target Date  04/10/19        PT Long Term Goals - 04/19/19 0854      PT LONG TERM GOAL #1   Title  Patient will be independent in finalized home exercise program to improve strength/mobility for better functional independence with ADLs    Time  8    Period  Weeks    Status  On-going    Target Date  05/08/19      PT LONG TERM GOAL #2   Title  Patient will ascend/descend 4 stairs without rail assist independently without loss of balance to improve ability to get in/out of home.    Baseline  03/13/19 Patient states she uses 1-2 HHA to climb 6 stairs to enter/exit home.     Time  8    Period  Weeks    Status  On-going    Target Date  05/08/19      PT LONG TERM GOAL #3   Title  Patient will increase Berg Balance score by > 6 points to demonstrate decreased fall risk during functional activities.    Baseline  03/13/19 46/56 BBT.04/19/19=49/56    Time  8    Period  Weeks    Status  Partially Met  Target Date  05/08/19      PT LONG TERM GOAL #4   Title  Patient will be independent in bending down towards floor and picking up small object (<5 pounds) and then stand back up without loss of balance as to improve ability to pick up and clean up room at home and perform vocational duties.    Baseline  03/13/19 LOB when bending over and lifting.    Time  8    Period  Weeks    Status  Partially Met    Target Date  05/08/19      PT LONG TERM GOAL #5   Title  Pt will increase gross LE strength to 5/5 for improved ability to complete ADLs and IADLs.    Baseline  03/13/19 R>L weakness, ankle DF, hip ext, hip flex, hip abd from 3+ to 4+/5.    Time  8    Period  Weeks    Status  Partially Met    Target Date  05/08/19            Plan - 05/10/19 0851    Clinical Impression Statement  Pt presents with unsteadiness on uneven surfaces and fatigues with therapeutic exercises.  Patient tolerated all interventions well this date and will benefit from continued skilled PT interventions to improve strength and balance and decrease risk of falling.   PT Frequency  2x / week    PT Duration  8 weeks    PT Treatment/Interventions  ADLs/Self Care Home Management;Aquatic Therapy;Cryotherapy;Electrical Stimulation;Moist Heat;Ultrasound;Gait training;Stair training;Functional mobility training;Therapeutic activities;Therapeutic exercise;Balance training;Neuromuscular re-education;Patient/family education;Orthotic Fit/Training;Manual techniques;Passive range of motion;Energy conservation    PT Next Visit Plan  progress balance intervention with compliant surfaces and dynamic  balance; airex tandem stance    PT Home Exercise Plan  no updates today    Consulted and Agree with Plan of Care  Patient       Patient will benefit from skilled therapeutic intervention in order to improve the following deficits and impairments:  Abnormal gait, Decreased activity tolerance, Decreased balance, Decreased endurance, Decreased mobility, Difficulty walking, Decreased strength, Improper body mechanics, Decreased range of motion  Visit Diagnosis: Other abnormalities of gait and mobility  Muscle weakness (generalized)  Unsteadiness on feet     Problem List Patient Active Problem List   Diagnosis Date Noted  . 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 Plantar fasciitis, bilateral 02/23/2019  . Dermatitis 02/23/2019    Alanson Puls, PT DPT 05/10/2019, 8:51 AM  [place] MAIN [facility] SERVICES [address] [place], [place], [address] Phone: [phone]   Fax:  [phone]  Name: Patricia Hahn MRN: [id number] Date of Birth: [birth year]

## 2019-05-15 ENCOUNTER — Ambulatory Visit: Payer: Medicare HMO | Admitting: Physical Therapy

## 2019-05-15 ENCOUNTER — Encounter: Payer: Self-pay | Admitting: Physical Therapy

## 2019-05-15 ENCOUNTER — Other Ambulatory Visit: Payer: Self-pay

## 2019-05-15 DIAGNOSIS — M6281 Muscle weakness (generalized): Secondary | ICD-10-CM

## 2019-05-15 DIAGNOSIS — R2681 Unsteadiness on feet: Secondary | ICD-10-CM

## 2019-05-15 DIAGNOSIS — R2689 Other abnormalities of gait and mobility: Secondary | ICD-10-CM

## 2019-05-15 NOTE — Therapy (Addendum)
[place] MAIN [facility] SERVICES [address] [place], [place], [address] Phone: [phone]   Fax:  [phone]  Physical Therapy Treatment  Patient Details  Name: Patricia Hahn MRN: [id number] Date of Birth: 08-31-1945 Referring Provider (PT): Lamonte Sakai   Encounter Date: 05/15/2019  PT End of Session - 05/15/19 0805    Visit Number  17    Number of Visits  17    Date for PT Re-Evaluation  05/08/19    PT Start Time  0801    PT Stop Time  0840    PT Time Calculation (min)  39 min    Equipment Utilized During Treatment  Gait belt    Activity Tolerance  Patient tolerated treatment well;No increased pain    Behavior During Therapy  [facility] for tasks assessed/performed       History reviewed. No pertinent past medical history.  Past Surgical History:  Procedure Laterality Date  . ABDOMINAL HYSTERECTOMY    . APPENDECTOMY    . CERVICAL FUSION    . COLONOSCOPY N/A 10/19/2014   Procedure: COLONOSCOPY;  Surgeon: Hulen Luster, MD;  Location: [facility] ENDOSCOPY;  Service: Gastroenterology;  Laterality: N/A;  . ORIF ANKLE FRACTURE    . SHOULDER ACROMIOPLASTY      There were no vitals filed for this visit.  Subjective Assessment - 05/15/19 0804    Subjective  Pt doing well this date, no new concerns.    Pertinent History  Patient is a 73 y.o. female with Hx of R ankle fracture in 2004 after falling off ladder, bilateral plantar fasciitis, shoulder acromioplasty. Is a current smoker of 2 cigarettes/day. Patient received physical therapy in 2017 for balance, which patient claimed resulted in significant improvements. She has c/o frequent LOB while standing, walking, bending over, and lifting, with 2 reported falls in the past 6 months.    Limitations  Lifting;Standing;Walking    How long can you sit comfortably?  N/A; can sit all day.    How long can you stand comfortably?  Able to stand 4 hour shifts at work    How long can you walk comfortably?   Able to work 4 hour shift without stops, uses shopping cart for assistance.    Patient Stated Goals  improve walking and balance    Currently in Pain?  No/denies    Pain Score  0-No pain    Pain Onset  In the past 7 days           Treatment: Neuromuscular Training: Stool: staggered stance, head turns side/side, up/down x 5 reps each, each foot in front; VCs for proper technique and positioning for each exercise staggered stance, trunk rotation with 2 lb rod, VC to keep UE straight and turn head with trunk  Rockerboard Lateral weight shift x10 reps each direction, no UE support, CGA for safety with VCs to tap in each direction, controlling the speed Anterior/Posterior weight shift x10 reps, no UE support, CGA for safety with VCs to utilize ankles to shift weight over toes and back to heels  Hurdle: Forward and backward stepping over hurdle x15 each direction, VCs to take big enough steps and to try to increase speed to work on coordination  Side stepping over hurdle 15x each direction  Blue Foam: Side stepping x10 on blue balance CGA for safety, VCs for taking a big enough step  Airex pad trunk rotation x2 min, CGA for safety, demonstrated difficulty with keeping arms extended and full rotation with  head turn Airex pad, balloon tapping to mirror x2 min, supervision for safety with varying directions and speed of balloon, VCs for utilizing both hands and minimizing UE support  BOSU: Lunge to BOSU ball x 10 , cues for going slow and to control the speed  Leg press 100 lbs x 20 x 3    Pt educated throughout session about proper posture and technique with exercises. Improved exercise technique, movement at target joints, use of target muscles after min to mod verbal, visual, tactile cues. CGA and Min to mod verbal cues used throughout with increased in postural sway and LOB most seen with narrow base of support and while on uneven surfaces. Continues to have balance deficits typical  with diagnosis. Patient performs intermediate level exercises without pain behaviors and needs verbal cuing for postural alignment and head positioning                     PT Education - 05/15/19 0804    Education Details  HEP    Person(s) Educated  Patient    Methods  Explanation;Tactile cues;Verbal cues    Comprehension  Verbalized understanding;Returned demonstration;Verbal cues required;Tactile cues required;Need further instruction       PT Short Term Goals - 04/19/19 0853      PT SHORT TERM GOAL #1   Title  Patient will be independent in initial home exercise program to improve strength/mobility for better functional independence with ADLs    Baseline  Provided HEP 03/13/19    Time  4    Period  Weeks    Status  Achieved    Target Date  04/10/19      PT SHORT TERM GOAL #2   Title  Patient will increase normal walking speed 10 meter walk test to >1.5ms as to improve gait speed for better community ambulation and to reduce fall risk.    Baseline  03/13/19 14.13 m/s normal walking speed with frequent LOB.04/19/19=    Time  4    Period  Weeks    Status  Partially Met    Target Date  04/10/19        PT Long Term Goals - 05/15/19 0815      PT LONG TERM GOAL #1   Title  Patient will be independent in finalized home exercise program to improve strength/mobility for better functional independence with ADLs    Time  8    Period  Weeks    Status  On-going    Target Date  07/03/19      PT LONG TERM GOAL #2   Title  Patient will ascend/descend 4 stairs without rail assist independently without loss of balance to improve ability to get in/out of home.    Baseline  03/13/19 Patient states she uses 1-2 HHA to climb 6 stairs to enter/exit home.    Time  8    Period  Weeks    Status  On-going    Target Date  07/03/19      PT LONG TERM GOAL #3   Title  Patient will increase Berg Balance score by > 6 points to demonstrate decreased fall risk during functional  activities.    Baseline  03/13/19 46/56 BBT.04/19/19=49/56    Time  8    Period  Weeks    Status  Partially Met    Target Date  07/03/19      PT LONG TERM GOAL #4   Title  Patient will be independent in bending  down towards floor and picking up small object (<5 pounds) and then stand back up without loss of balance as to improve ability to pick up and clean up room at home and perform vocational duties.    Baseline  03/13/19 LOB when bending over and lifting.    Time  8    Period  Weeks    Status  Partially Met    Target Date  07/03/19      PT LONG TERM GOAL #5   Title  Pt will increase gross LE strength to 5/5 for improved ability to complete ADLs and IADLs.    Baseline  03/13/19 R>L weakness, ankle DF, hip ext, hip flex, hip abd from 3+ to 4+/5.    Time  8    Period  Weeks    Status  Partially Met    Target Date  07/03/19            Plan - 05/15/19 0805    Clinical Impression Statement  Pt presents with unsteadiness on uneven surfaces and fatigues with therapeutic exercises. Patient needs assist with beginning moderate balance activities and needs CGA assist with standing activities. Patient demonstrates difficulty with dynamic standing balance and with narrow base of support and increased challenges for LE.  Patient tolerated all interventions well this date and skilled PT will continue to improve mobility and strength.    PT Frequency  2x / week    PT Duration  8 weeks    PT Treatment/Interventions  ADLs/Self Care Home Management;Aquatic Therapy;Cryotherapy;Electrical Stimulation;Moist Heat;Ultrasound;Gait training;Stair training;Functional mobility training;Therapeutic activities;Therapeutic exercise;Balance training;Neuromuscular re-education;Patient/family education;Orthotic Fit/Training;Manual techniques;Passive range of motion;Energy conservation    PT Next Visit Plan  progress balance intervention with compliant surfaces and dynamic balance; airex tandem stance    PT  Home Exercise Plan  no updates today    Consulted and Agree with Plan of Care  Patient       Patient will benefit from skilled therapeutic intervention in order to improve the following deficits and impairments:  Abnormal gait, Decreased activity tolerance, Decreased balance, Decreased endurance, Decreased mobility, Difficulty walking, Decreased strength, Improper body mechanics, Decreased range of motion  Visit Diagnosis: Other abnormalities of gait and mobility - Plan: PT plan of care cert/re-cert  Muscle weakness (generalized) - Plan: PT plan of care cert/re-cert  Unsteadiness on feet - Plan: PT plan of care cert/re-cert     Problem List Patient Active Problem List   Diagnosis Date Noted  . 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 Plantar fasciitis, bilateral 02/23/2019  . Dermatitis 02/23/2019    Alanson Puls, PT DPT 05/15/2019, 8:16 AM  [place] MAIN [facility] SERVICES [address] [place], [place], [address] Phone: [phone]   Fax:  [phone]  Name: Patricia Hahn MRN: [id number] Date of Birth: 02-22-1946

## 2019-05-15 NOTE — Addendum Note (Signed)
Addended by: Alanson Puls on: 05/15/2019 08:29 AM   Modules accepted: Orders

## 2019-05-17 ENCOUNTER — Ambulatory Visit: Payer: Medicare HMO | Admitting: Physical Therapy

## 2019-05-22 ENCOUNTER — Ambulatory Visit: Payer: Medicare HMO | Admitting: Physical Therapy

## 2019-05-22 ENCOUNTER — Encounter: Payer: Self-pay | Admitting: Physical Therapy

## 2019-05-22 ENCOUNTER — Other Ambulatory Visit: Payer: Self-pay

## 2019-05-22 DIAGNOSIS — R2689 Other abnormalities of gait and mobility: Secondary | ICD-10-CM

## 2019-05-22 DIAGNOSIS — R2681 Unsteadiness on feet: Secondary | ICD-10-CM

## 2019-05-22 DIAGNOSIS — M6281 Muscle weakness (generalized): Secondary | ICD-10-CM

## 2019-05-22 NOTE — Therapy (Signed)
[place] MAIN [facility] SERVICES [address] [place], [place], [address] Phone: [phone]   Fax:  [phone]  Physical Therapy Treatment  Patient Details  Name: Patricia Hahn MRN: [id number] Date of Birth: 01-05-1946 Referring Provider (PT): Lamonte Sakai   Encounter Date: 05/22/2019  PT End of Session - 05/22/19 0852    Visit Number  18    Number of Visits  17    Date for PT Re-Evaluation  05/08/19    PT Start Time  0850    PT Stop Time  0925    PT Time Calculation (min)  35 min    Equipment Utilized During Treatment  Gait belt    Activity Tolerance  Patient tolerated treatment well;No increased pain    Behavior During Therapy  [facility] for tasks assessed/performed       History reviewed. No pertinent past medical history.  Past Surgical History:  Procedure Laterality Date  . ABDOMINAL HYSTERECTOMY    . APPENDECTOMY    . CERVICAL FUSION    . COLONOSCOPY N/A 10/19/2014   Procedure: COLONOSCOPY;  Surgeon: Hulen Luster, MD;  Location: [facility] ENDOSCOPY;  Service: Gastroenterology;  Laterality: N/A;  . ORIF ANKLE FRACTURE    . SHOULDER ACROMIOPLASTY      There were no vitals filed for this visit.  Subjective Assessment - 05/22/19 0851    Subjective  Pt doing well this date, no new concerns.    Pertinent History  Patient is a 73 y.o. female with Hx of R ankle fracture in 2004 after falling off ladder, bilateral plantar fasciitis, shoulder acromioplasty. Is a current smoker of 2 cigarettes/day. Patient received physical therapy in 2017 for balance, which patient claimed resulted in significant improvements. She has c/o frequent LOB while standing, walking, bending over, and lifting, with 2 reported falls in the past 6 months.    Limitations  Lifting;Standing;Walking    How long can you sit comfortably?  N/A; can sit all day.    How long can you stand comfortably?  Able to stand 4 hour shifts at work    How long can you walk comfortably?   Able to work 4 hour shift without stops, uses shopping cart for assistance.    Patient Stated Goals  improve walking and balance    Currently in Pain?  No/denies    Pain Score  0-No pain    Pain Onset  In the past 7 days       Treatment: Neuromuscular Training: Stool: staggered stance, trunk rotation with 2 lb rod, VC to keep UE straight and turn head with trunk  1/2 foam horizontal standing flat side up x 2 mins  1/2 foam tandem  standing flat side up x 2 mins, alternate fwd foot every min   Hurdle: Forward and backward stepping over hurdle x15 each direction, VCs to take big enough steps and to try to increase speed to work on coordination  Side stepping over hurdle 15x each direction  Blue Foam: Side stepping x10 on blue balance CGA for safety, VCs for taking a big enough step  Airex pad trunk rotation x2 min, CGA for safety, demonstrated difficulty with keeping arms extended and full rotation with head turn  Leg press x 100 lbs x 20 x 3   Pt educated throughout session about proper posture and technique with exercises. Improved exercise technique, movement at target joints, use of target muscles after min to mod verbal, visual, tactile cues. CGA and Min to  mod verbal cues used throughout with increased in postural sway and LOB most seen with narrow base of support and while on uneven surfaces. Continues to have balance deficits typical with diagnosis.                      PT Education - 05/22/19 [phone]    Education Details  HEP    Person(s) Educated  Patient    Methods  Explanation;Demonstration    Comprehension  Verbalized understanding;Returned demonstration;Verbal cues required;Tactile cues required;Need further instruction       PT Short Term Goals - 04/19/19 0853      PT SHORT TERM GOAL #1   Title  Patient will be independent in initial home exercise program to improve strength/mobility for better functional independence with ADLs    Baseline   Provided HEP 03/13/19    Time  4    Period  Weeks    Status  Achieved    Target Date  04/10/19      PT SHORT TERM GOAL #2   Title  Patient will increase normal walking speed 10 meter walk test to >1.60ms as to improve gait speed for better community ambulation and to reduce fall risk.    Baseline  03/13/19 14.13 m/s normal walking speed with frequent LOB.04/19/19=    Time  4    Period  Weeks    Status  Partially Met    Target Date  04/10/19        PT Long Term Goals - 05/15/19 0815      PT LONG TERM GOAL #1   Title  Patient will be independent in finalized home exercise program to improve strength/mobility for better functional independence with ADLs    Time  8    Period  Weeks    Status  On-going    Target Date  07/03/19      PT LONG TERM GOAL #2   Title  Patient will ascend/descend 4 stairs without rail assist independently without loss of balance to improve ability to get in/out of home.    Baseline  03/13/19 Patient states she uses 1-2 HHA to climb 6 stairs to enter/exit home.    Time  8    Period  Weeks    Status  On-going    Target Date  07/03/19      PT LONG TERM GOAL #3   Title  Patient will increase Berg Balance score by > 6 points to demonstrate decreased fall risk during functional activities.    Baseline  03/13/19 46/56 BBT.04/19/19=49/56    Time  8    Period  Weeks    Status  Partially Met    Target Date  07/03/19      PT LONG TERM GOAL #4   Title  Patient will be independent in bending down towards floor and picking up small object (<5 pounds) and then stand back up without loss of balance as to improve ability to pick up and clean up room at home and perform vocational duties.    Baseline  03/13/19 LOB when bending over and lifting.    Time  8    Period  Weeks    Status  Partially Met    Target Date  07/03/19      PT LONG TERM GOAL #5   Title  Pt will increase gross LE strength to 5/5 for improved ability to complete ADLs and IADLs.    Baseline   03/13/19 R>L weakness,  ankle DF, hip ext, hip flex, hip abd from 3+ to 4+/5.    Time  8    Period  Weeks    Status  Partially Met    Target Date  07/03/19            Plan - 05/22/19 0853    Clinical Impression Statement  Pt was able to progress dynamic balance exercises today, noting improved postural reactions with LOB and moving outside normal BOS.  Pt was able to perform all exercises on uneven surfaces with minimal LOB noted.  Single limb stability activities were added today, with decrease control noted when attempting to perform tasks.  Pt would continue to benefit from skilled therapy services to address further balance impairments and decrease falls risk.    PT Frequency  2x / week    PT Duration  8 weeks    PT Treatment/Interventions  ADLs/Self Care Home Management;Aquatic Therapy;Cryotherapy;Electrical Stimulation;Moist Heat;Ultrasound;Gait training;Stair training;Functional mobility training;Therapeutic activities;Therapeutic exercise;Balance training;Neuromuscular re-education;Patient/family education;Orthotic Fit/Training;Manual techniques;Passive range of motion;Energy conservation    PT Next Visit Plan  progress balance intervention with compliant surfaces and dynamic balance; airex tandem stance    PT Home Exercise Plan  no updates today    Consulted and Agree with Plan of Care  Patient       Patient will benefit from skilled therapeutic intervention in order to improve the following deficits and impairments:  Abnormal gait, Decreased activity tolerance, Decreased balance, Decreased endurance, Decreased mobility, Difficulty walking, Decreased strength, Improper body mechanics, Decreased range of motion  Visit Diagnosis: Other abnormalities of gait and mobility  Muscle weakness (generalized)  Unsteadiness on feet     Problem List Patient Active Problem List   Diagnosis Date Noted  . 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 Plantar fasciitis, bilateral 02/23/2019  .  Dermatitis 02/23/2019    Alanson Puls, PT DPT 05/22/2019, 8:53 AM  [place] MAIN [facility] SERVICES [address] [place], [place], [address] Phone: [phone]   Fax:  [phone]  Name: Patricia Hahn MRN: [id number] Date of Birth: [birth year]

## 2019-05-24 ENCOUNTER — Ambulatory Visit: Payer: Medicare HMO | Admitting: Physical Therapy

## 2019-05-24 ENCOUNTER — Other Ambulatory Visit: Payer: Self-pay

## 2019-05-24 ENCOUNTER — Encounter: Payer: Self-pay | Admitting: Physical Therapy

## 2019-05-24 DIAGNOSIS — R2689 Other abnormalities of gait and mobility: Secondary | ICD-10-CM

## 2019-05-24 DIAGNOSIS — R2681 Unsteadiness on feet: Secondary | ICD-10-CM

## 2019-05-24 DIAGNOSIS — M6281 Muscle weakness (generalized): Secondary | ICD-10-CM

## 2019-05-24 NOTE — Therapy (Signed)
[place] MAIN [facility] SERVICES [address] [place], [place], [address] Phone: [phone]   Fax:  [phone]  Physical Therapy Treatment  Patient Details  Name: Patricia Hahn MRN: [id number] Date of Birth: [birth year] Referring Provider (PT): Lamonte Sakai   Encounter Date: 05/24/2019  PT End of Session - 05/24/19 0849    Visit Number  19    Number of Visits  17    Date for PT Re-Evaluation  05/08/19    PT Start Time  0845    PT Stop Time  0925    PT Time Calculation (min)  40 min    Equipment Utilized During Treatment  Gait belt    Activity Tolerance  Patient tolerated treatment well;No increased pain    Behavior During Therapy  [facility] for tasks assessed/performed       History reviewed. No pertinent past medical history.  Past Surgical History:  Procedure Laterality Date  . ABDOMINAL HYSTERECTOMY    . APPENDECTOMY    . CERVICAL FUSION    . COLONOSCOPY N/A 10/19/2014   Procedure: COLONOSCOPY;  Surgeon: Hulen Luster, MD;  Location: [facility] ENDOSCOPY;  Service: Gastroenterology;  Laterality: N/A;  . ORIF ANKLE FRACTURE    . SHOULDER ACROMIOPLASTY      There were no vitals filed for this visit.  Subjective Assessment - 05/24/19 0849    Subjective  Pt doing well this date, no new concerns.    Pertinent History  Patient is a 73 y.o. female with Hx of R ankle fracture in 2004 after falling off ladder, bilateral plantar fasciitis, shoulder acromioplasty. Is a current smoker of 2 cigarettes/day. Patient received physical therapy in 2017 for balance, which patient claimed resulted in significant improvements. She has c/o frequent LOB while standing, walking, bending over, and lifting, with 2 reported falls in the past 6 months.    Limitations  Lifting;Standing;Walking    How long can you sit comfortably?  N/A; can sit all day.    How long can you stand comfortably?  Able to stand 4 hour shifts at work    How long can you walk comfortably?   Able to work 4 hour shift without stops, uses shopping cart for assistance.    Patient Stated Goals  improve walking and balance    Currently in Pain?  No/denies    Pain Score  0-No pain    Pain Onset  In the past 7 days       Neuromuscular Re-education   Tandem stand on floor without UE support x 2 lengths Side stepping blue foam  without UE support x 2 lengths Heel/toe raises without UE support 3s hold x 10 each 1/2 foam roll balance with flat side up 30s x 2 reps 1/2 foam roll balance with flat side down 30s x 2 reps Lateral side steps from foam to 6 inch stool left and right x 15 Matrix 22. 5 lbs x 3 reps fwd/bwd, side to side   Ther-ex  Octane fitness x 5 mins  Hip flexion marches with 2# ankle weights x 10 bilateral; Hip abduction with 2# x 10 bilateral; Hip extension with 2# x 10 bilateral; Lunges to BOSU ball x 15 BLE Leg press x 100 lbs x 20 x 3  Heel raises 45 lbs x 20 x 3  Step ups to 6-inch stool x 20   Patient needs occasional verbal cueing to improve posture and cueing to correctly perform exercises slowly, holding at end of range  to increase motor firing of desired muscle to encourage fatigue.       Pt educated throughout session about proper posture and technique with exercises. Improved exercise technique, movement at target joints, use of target muscles after min to mod verbal, visual, tactile cues. CGA and Min to mod verbal cues used throughout with increased in postural sway and LOB most seen with narrow base of support and while on uneven surfaces. Continues to have balance deficits typical with diagnosis. Patient performs intermediate level exercises without pain behaviors and needs verbal cuing for postural alignment and head positioning Tactile cues and assistance needed to keep lower leg and knee in neutral to avoid compensations with ankle motions.                        PT Education - 05/24/19 0849    Education Details  HEP     Person(s) Educated  Patient    Methods  Explanation;Demonstration;Tactile cues;Verbal cues    Comprehension  Verbalized understanding;Returned demonstration;Verbal cues required;Tactile cues required       PT Short Term Goals - 04/19/19 0853      PT SHORT TERM GOAL #1   Title  Patient will be independent in initial home exercise program to improve strength/mobility for better functional independence with ADLs    Baseline  Provided HEP 03/13/19    Time  4    Period  Weeks    Status  Achieved    Target Date  04/10/19      PT SHORT TERM GOAL #2   Title  Patient will increase normal walking speed 10 meter walk test to >1.37ms as to improve gait speed for better community ambulation and to reduce fall risk.    Baseline  03/13/19 14.13 m/s normal walking speed with frequent LOB.04/19/19=    Time  4    Period  Weeks    Status  Partially Met    Target Date  04/10/19        PT Long Term Goals - 05/15/19 0815      PT LONG TERM GOAL #1   Title  Patient will be independent in finalized home exercise program to improve strength/mobility for better functional independence with ADLs    Time  8    Period  Weeks    Status  On-going    Target Date  07/03/19      PT LONG TERM GOAL #2   Title  Patient will ascend/descend 4 stairs without rail assist independently without loss of balance to improve ability to get in/out of home.    Baseline  03/13/19 Patient states she uses 1-2 HHA to climb 6 stairs to enter/exit home.    Time  8    Period  Weeks    Status  On-going    Target Date  07/03/19      PT LONG TERM GOAL #3   Title  Patient will increase Berg Balance score by > 6 points to demonstrate decreased fall risk during functional activities.    Baseline  03/13/19 46/56 BBT.04/19/19=49/56    Time  8    Period  Weeks    Status  Partially Met    Target Date  07/03/19      PT LONG TERM GOAL #4   Title  Patient will be independent in bending down towards floor and picking up small object  (<5 pounds) and then stand back up without loss of balance as to improve ability  to pick up and clean up room at home and perform vocational duties.    Baseline  03/13/19 LOB when bending over and lifting.    Time  8    Period  Weeks    Status  Partially Met    Target Date  07/03/19      PT LONG TERM GOAL #5   Title  Pt will increase gross LE strength to 5/5 for improved ability to complete ADLs and IADLs.    Baseline  03/13/19 R>L weakness, ankle DF, hip ext, hip flex, hip abd from 3+ to 4+/5.    Time  8    Period  Weeks    Status  Partially Met    Target Date  07/03/19            Plan - 05/24/19 0850    Clinical Impression Statement  Patient instructed in intermediate strengthening and balance exercise.  Patient requires min Vcs for correct exercise technique including to improve LE control with standing exercise. Patient demonstrates better quad control with SLS tasks with rail assist. Patient would benefit from additional skilled PT intervention to improve balance/gait safety and reduce fall risk.   PT Frequency  2x / week    PT Duration  8 weeks    PT Treatment/Interventions  ADLs/Self Care Home Management;Aquatic Therapy;Cryotherapy;Electrical Stimulation;Moist Heat;Ultrasound;Gait training;Stair training;Functional mobility training;Therapeutic activities;Therapeutic exercise;Balance training;Neuromuscular re-education;Patient/family education;Orthotic Fit/Training;Manual techniques;Passive range of motion;Energy conservation    PT Next Visit Plan  progress balance intervention with compliant surfaces and dynamic balance; airex tandem stance    PT Home Exercise Plan  no updates today    Consulted and Agree with Plan of Care  Patient       Patient will benefit from skilled therapeutic intervention in order to improve the following deficits and impairments:  Abnormal gait, Decreased activity tolerance, Decreased balance, Decreased endurance, Decreased mobility, Difficulty  walking, Decreased strength, Improper body mechanics, Decreased range of motion  Visit Diagnosis: Other abnormalities of gait and mobility  Muscle weakness (generalized)  Unsteadiness on feet     Problem List Patient Active Problem List   Diagnosis Date Noted  . 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 Plantar fasciitis, bilateral 02/23/2019  . Dermatitis 02/23/2019    Alanson Puls, PT DPT 05/24/2019, 8:51 AM  [place] MAIN [facility] SERVICES [address] [place], [place], [address] Phone: [phone]   Fax:  [phone]  Name: Patricia Hahn MRN: [id number] Date of Birth: July 09, 1945

## 2019-05-29 ENCOUNTER — Encounter: Payer: Self-pay | Admitting: Physical Therapy

## 2019-05-29 ENCOUNTER — Other Ambulatory Visit: Payer: Self-pay

## 2019-05-29 ENCOUNTER — Ambulatory Visit: Payer: Medicare HMO

## 2019-05-29 DIAGNOSIS — R2689 Other abnormalities of gait and mobility: Secondary | ICD-10-CM | POA: Diagnosis not present

## 2019-05-29 DIAGNOSIS — M6281 Muscle weakness (generalized): Secondary | ICD-10-CM

## 2019-05-29 DIAGNOSIS — R2681 Unsteadiness on feet: Secondary | ICD-10-CM

## 2019-05-29 NOTE — Therapy (Signed)
[place] MAIN [facility] SERVICES [address] [place], [place], [address] Phone: [phone]   Fax:  [phone]  Physical Therapy Treatment Physical Therapy Progress Note  Dates of reporting period  04/19/2019   to   05/29/2019   Patient Details  Name: Patricia Hahn MRN: [id number] Date of Birth: March 05, 1946 Referring Provider (PT): Lamonte Sakai   Encounter Date: 05/29/2019  PT End of Session - 05/29/19 0758    Visit Number  20    Number of Visits  17    Date for PT Re-Evaluation  07/03/19    Authorization Type  next visit 1/10, last PN 05/29/2019    PT Start Time  0800    PT Stop Time  0845    PT Time Calculation (min)  45 min    Equipment Utilized During Treatment  Gait belt    Activity Tolerance  Patient tolerated treatment well;No increased pain    Behavior During Therapy  [facility] for tasks assessed/performed       History reviewed. No pertinent past medical history.  Past Surgical History:  Procedure Laterality Date  . ABDOMINAL HYSTERECTOMY    . APPENDECTOMY    . CERVICAL FUSION    . COLONOSCOPY N/A 10/19/2014   Procedure: COLONOSCOPY;  Surgeon: Hulen Luster, MD;  Location: [facility] ENDOSCOPY;  Service: Gastroenterology;  Laterality: N/A;  . ORIF ANKLE FRACTURE    . SHOULDER ACROMIOPLASTY      There were no vitals filed for this visit.  Subjective Assessment - 05/29/19 0757    Subjective  Patient reported that she is doing well this morning, no falls/stumbles, no pain. Overall thinks therapy is going well.    Pertinent History  Patient is a 73 y.o. female with Hx of R ankle fracture in 2004 after falling off ladder, bilateral plantar fasciitis, shoulder acromioplasty. Is a current smoker of 2 cigarettes/day. Patient received physical therapy in 2017 for balance, which patient claimed resulted in significant improvements. She has c/o frequent LOB while standing, walking, bending over, and lifting, with 2 reported falls in the past 6  months.    Limitations  Lifting;Standing;Walking    How long can you sit comfortably?  N/A; can sit all day.    How long can you stand comfortably?  Able to stand 4 hour shifts at work    How long can you walk comfortably?  Able to work 4 hour shift without stops, uses shopping cart for assistance.    Patient Stated Goals  improve walking and balance    Currently in Pain?  No/denies    Pain Onset  In the past 7 days       TREATMENT: Neuromuscular Re-education  Side stepping blue foam  without UE support x 4 lengths 1/2 foam roll tandem balance with flat side up 30s x 2 reps 1/2 foamroll tandem balance with flat side down 30s x 2 reps Lateral side steps from foam to foam over orange hurdle x10 bilaterally Matrix 22. 5 lbs x , 17.5# 1 rep reps fwd/bwd, side to side 17.5# 2 reps   CGA-minA for safety due to intermittent LOB during balance activities  Ther-ex  Octane fitness x 5 mins  Hip flexion marches with 2# ankle weights,  2 x 10 bilateral; Hip abduction with 2#,  2 x 10 bilateral; Hip extension with 2#, 2 x 10 bilateral; Lunges to BOSU ball x 15 BLE Heel/toe raises without UE support 3s hold 2 x 10 each   Patient  needs occasional verbal cueing to improve posture and cueing to correctly perform exercises slowly, holding at end of range to increase motor firing of desired muscle to encourage fatigue. Pt educated throughout session about proper posture and technique with exercises. Improved exercise technique, movement at target joints, use of target muscles after min to mod verbal, visual, tactile cues.   Patient reported overall therapy has been helpful. Stated that she feels her walking and her balance are improved. She continues to make progress towards goals. the patient is challenged by narrow base support activities and uneven surfaces and needed verbal/tactile cues for technique/form. Goals carried forward this session due to recent update.The patient would benefit from further  skilled PT intervention to maximize safety, mobility, and functional abilities.     PT Education - 05/29/19 0757    Education Details  therapeutic exercise/technique    Person(s) Educated  Patient    Methods  Explanation;Demonstration;Tactile cues;Verbal cues    Comprehension  Verbalized understanding;Returned demonstration;Verbal cues required;Tactile cues required       PT Short Term Goals - 05/29/19 0802      PT SHORT TERM GOAL #1   Title  Patient will be independent in initial home exercise program to improve strength/mobility for better functional independence with ADLs    Baseline  Provided HEP 03/13/19    Time  4    Period  Weeks    Status  Achieved    Target Date  04/10/19      PT SHORT TERM GOAL #2   Title  Patient will increase normal walking speed 10 meter walk test to >1.28ms as to improve gait speed for better community ambulation and to reduce fall risk.    Baseline  03/13/19 14.13 m/s normal walking speed with frequent LOB.04/19/19=    Time  5    Period  Weeks    Status  Partially Met    Target Date  07/02/18        PT Long Term Goals - 05/29/19 0803      PT LONG TERM GOAL #1   Title  Patient will be independent in finalized home exercise program to improve strength/mobility for better functional independence with ADLs    Baseline  9.94    Time  8    Period  Weeks    Status  On-going    Target Date  07/03/19      PT LONG TERM GOAL #2   Title  Patient will ascend/descend 4 stairs without rail assist independently without loss of balance to improve ability to get in/out of home.    Baseline  03/13/19 Patient states she uses 1-2 HHA to climb 6 stairs to enter/exit home.    Time  5    Period  Weeks    Target Date  07/03/19      PT LONG TERM GOAL #3   Title  Patient will increase Berg Balance score by > 6 points to demonstrate decreased fall risk during functional activities.    Baseline  03/13/19 46/56 BBT.04/19/19=49/56    Time  5    Period  Weeks     Status  Partially Met    Target Date  07/03/19      PT LONG TERM GOAL #4   Title  Patient will be independent in bending down towards floor and picking up small object (<5 pounds) and then stand back up without loss of balance as to improve ability to pick up and clean up room at home  and perform vocational duties.    Baseline  03/13/19 LOB when bending over and lifting.    Time  5    Period  Weeks    Status  Partially Met    Target Date  07/03/19      PT LONG TERM GOAL #5   Title  Pt will increase gross LE strength to 5/5 for improved ability to complete ADLs and IADLs.    Baseline  03/13/19 R>L weakness, ankle DF, hip ext, hip flex, hip abd from 3+ to 4+/5.    Time  5    Period  Weeks    Status  Partially Met    Target Date  07/03/19            Plan - 05/29/19 0758    Clinical Impression Statement  Patient reported overall therapy has been helpful. Stated that she feels her walking and her balance are improved. She continues to make progress towards goals. the patient is challenged by narrow base support activities and uneven surfaces and needed verbal/tactile cues for technique/form. The patient would benefit from further skilled PT intervention to maximize safety, mobility, and functional abilities.    Personal Factors and Comorbidities  Age;Fitness;Profession    Examination-Activity Limitations  Bend;Carry;Lift;Locomotion Level;Reach Overhead;Squat;Stairs;Stand;Transfers    Examination-Participation Restrictions  Yard Work;Shop    Rehab Potential  Excellent    PT Frequency  2x / week    PT Duration  8 weeks    PT Treatment/Interventions  ADLs/Self Care Home Management;Aquatic Therapy;Cryotherapy;Electrical Stimulation;Moist Heat;Ultrasound;Gait training;Stair training;Functional mobility training;Therapeutic activities;Therapeutic exercise;Balance training;Neuromuscular re-education;Patient/family education;Orthotic Fit/Training;Manual techniques;Passive range of  motion;Energy conservation    PT Next Visit Plan  progress balance intervention with compliant surfaces and dynamic balance; airex tandem stance    PT Home Exercise Plan  no updates today    Consulted and Agree with Plan of Care  Patient       Patient will benefit from skilled therapeutic intervention in order to improve the following deficits and impairments:  Abnormal gait, Decreased activity tolerance, Decreased balance, Decreased endurance, Decreased mobility, Difficulty walking, Decreased strength, Improper body mechanics, Decreased range of motion  Visit Diagnosis: Other abnormalities of gait and mobility  Muscle weakness (generalized)  Unsteadiness on feet     Problem List Patient Active Problem List   Diagnosis Date Noted  . 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 Plantar fasciitis, bilateral 02/23/2019  . Dermatitis 02/23/2019    Lieutenant Diego PT, DPT 8:50 AM,05/29/19   [place] MAIN [facility] SERVICES [address] [place], [place], [address] Phone: [phone]   Fax:  [phone]  Name: Patricia Hahn MRN: [id number] Date of Birth: 12/17/1945

## 2019-05-31 ENCOUNTER — Encounter: Payer: Self-pay | Admitting: Physical Therapy

## 2019-05-31 ENCOUNTER — Ambulatory Visit: Payer: Medicare HMO

## 2019-05-31 ENCOUNTER — Other Ambulatory Visit: Payer: Self-pay

## 2019-05-31 DIAGNOSIS — M6281 Muscle weakness (generalized): Secondary | ICD-10-CM

## 2019-05-31 DIAGNOSIS — R2689 Other abnormalities of gait and mobility: Secondary | ICD-10-CM | POA: Diagnosis not present

## 2019-05-31 DIAGNOSIS — R2681 Unsteadiness on feet: Secondary | ICD-10-CM

## 2019-05-31 NOTE — Therapy (Signed)
[place] MAIN [facility] SERVICES [address] [place], [place], [address] Phone: [phone]   Fax:  [phone]  Physical Therapy Treatment  Patient Details  Name: Reginald Mangels MRN: [id number] Date of Birth: 1945/11/14 Referring Provider (PT): Lamonte Sakai   Encounter Date: 05/31/2019  PT End of Session - 05/31/19 0936    Visit Number  21    Number of Visits  33    Date for PT Re-Evaluation  07/03/19    Authorization Type  --    PT Start Time  0845    PT Stop Time  0930    PT Time Calculation (min)  45 min    Equipment Utilized During Treatment  Gait belt    Activity Tolerance  Patient tolerated treatment well;No increased pain    Behavior During Therapy  [facility] for tasks assessed/performed       History reviewed. No pertinent past medical history.  Past Surgical History:  Procedure Laterality Date  . ABDOMINAL HYSTERECTOMY    . APPENDECTOMY    . CERVICAL FUSION    . COLONOSCOPY N/A 10/19/2014   Procedure: COLONOSCOPY;  Surgeon: Hulen Luster, MD;  Location: [facility] ENDOSCOPY;  Service: Gastroenterology;  Laterality: N/A;  . ORIF ANKLE FRACTURE    . SHOULDER ACROMIOPLASTY      There were no vitals filed for this visit.  Subjective Assessment - 05/31/19 0846    Subjective  Patient reported that she is doing well this morning, no falls/stumbles, no pain. Overall thinks therapy is going well.  She feels like her balance and strength while moving around at work is getting better.   Pertinent History  Patient is a 73 y.o. female with Hx of R ankle fracture in 2004 after falling off ladder, bilateral plantar fasciitis, shoulder acromioplasty. Is a current smoker of 2 cigarettes/day. Patient received physical therapy in 2017 for balance, which patient claimed resulted in significant improvements. She has c/o frequent LOB while standing, walking, bending over, and lifting, with 2 reported falls in the past 6 months.    Limitations   Lifting;Standing;Walking    How long can you sit comfortably?  N/A; can sit all day.    How long can you stand comfortably?  Able to stand 4 hour shifts at work    How long can you walk comfortably?  Able to work 4 hour shift without stops, uses shopping cart for assistance.    Patient Stated Goals  improve walking and balance    Currently in Pain?  Yes    Pain Score  0-No pain    Pain Onset  In the past 7 days        Today's Treatment:    Ther-ex  Octane fitness x 5 mins  Hip flexion marches with 2# ankle weights,  2 x 10 bilateral; Hip abduction with 2#,  2 x 10 bilateral; Hip extension with 2#, 2 x 10 bilateral; Heel/toe raises without UE support 3s hold 2 x 10 each Chair squats (holding 2# med ball) 2x8 with focus on slow eccentric lower  Neuromuscular Re-education  Side stepping blue foam  without UE support x 4 lengths 1/2 foam roll tandem balance with flat side up 30s x 2 reps 1/2 foamroll tandem balance with flat side down 30s x 2 reps 1/2 foamroll standing with flat side up toes/heels facing long side 30s x 4 (2 sets with dual task lift med ball 5x) Lateral side steps from foam to foam over orange hurdle  x10 bilaterally   CGA-minA for safety due to intermittent LOB during balance activities    PT Education - 05/31/19 0854    Education Details  technique/ exercise    Person(s) Educated  Patient    Methods  Explanation;Demonstration;Tactile cues    Comprehension  Verbalized understanding;Returned demonstration;Verbal cues required;Tactile cues required       PT Short Term Goals - 05/29/19 0802      PT SHORT TERM GOAL #1   Title  Patient will be independent in initial home exercise program to improve strength/mobility for better functional independence with ADLs    Baseline  Provided HEP 03/13/19    Time  4    Period  Weeks    Status  Achieved    Target Date  04/10/19      PT SHORT TERM GOAL #2   Title  Patient will increase normal walking speed 10 meter  walk test to >1.66ms as to improve gait speed for better community ambulation and to reduce fall risk.    Baseline  03/13/19 14.13 m/s normal walking speed with frequent LOB.04/19/19=    Time  5    Period  Weeks    Status  Partially Met    Target Date  07/02/18        PT Long Term Goals - 05/29/19 0803      PT LONG TERM GOAL #1   Title  Patient will be independent in finalized home exercise program to improve strength/mobility for better functional independence with ADLs    Baseline  9.94    Time  8    Period  Weeks    Status  On-going    Target Date  07/03/19      PT LONG TERM GOAL #2   Title  Patient will ascend/descend 4 stairs without rail assist independently without loss of balance to improve ability to get in/out of home.    Baseline  03/13/19 Patient states she uses 1-2 HHA to climb 6 stairs to enter/exit home.    Time  5    Period  Weeks    Target Date  07/03/19      PT LONG TERM GOAL #3   Title  Patient will increase Berg Balance score by > 6 points to demonstrate decreased fall risk during functional activities.    Baseline  03/13/19 46/56 BBT.04/19/19=49/56    Time  5    Period  Weeks    Status  Partially Met    Target Date  07/03/19      PT LONG TERM GOAL #4   Title  Patient will be independent in bending down towards floor and picking up small object (<5 pounds) and then stand back up without loss of balance as to improve ability to pick up and clean up room at home and perform vocational duties.    Baseline  03/13/19 LOB when bending over and lifting.    Time  5    Period  Weeks    Status  Partially Met    Target Date  07/03/19      PT LONG TERM GOAL #5   Title  Pt will increase gross LE strength to 5/5 for improved ability to complete ADLs and IADLs.    Baseline  03/13/19 R>L weakness, ankle DF, hip ext, hip flex, hip abd from 3+ to 4+/5.    Time  5    Period  Weeks    Status  Partially Met    Target Date  07/03/19            Plan - 05/31/19  0940    Clinical Impression Statement  Pt tolerated today's therapy session well.  She was most challenged with dual task balance activities and required use of UE on parallel bars and occasional PT CGA to regain balance.  Pt would contiue to benefit from skilled PT to maximize saftety, mobilty, and functional activities.    Personal Factors and Comorbidities  Age;Fitness;Profession    Examination-Activity Limitations  Bend;Carry;Lift;Locomotion Level;Reach Overhead;Squat;Stairs;Stand;Transfers    Examination-Participation Restrictions  Yard Work;Shop    Rehab Potential  Excellent    PT Frequency  2x / week    PT Duration  8 weeks    PT Treatment/Interventions  ADLs/Self Care Home Management;Aquatic Therapy;Cryotherapy;Electrical Stimulation;Moist Heat;Ultrasound;Gait training;Stair training;Functional mobility training;Therapeutic activities;Therapeutic exercise;Balance training;Neuromuscular re-education;Patient/family education;Orthotic Fit/Training;Manual techniques;Passive range of motion;Energy conservation    PT Next Visit Plan  progress balance intervention with compliant surfaces and dynamic balance; airex tandem stance    PT Home Exercise Plan  no updates today    Consulted and Agree with Plan of Care  Patient       Patient will benefit from skilled therapeutic intervention in order to improve the following deficits and impairments:  Abnormal gait, Decreased activity tolerance, Decreased balance, Decreased endurance, Decreased mobility, Difficulty walking, Decreased strength, Improper body mechanics, Decreased range of motion  Visit Diagnosis: Other abnormalities of gait and mobility  Muscle weakness (generalized)  Unsteadiness on feet     Problem List Patient Active Problem List   Diagnosis Date Noted  . 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 Plantar fasciitis, bilateral 02/23/2019  . Dermatitis 02/23/2019    Pincus Badder 05/31/2019, 9:43 AM  Merdis Delay, PT,  DPT Physical Therapist - [facility]  Outpatient Physical Therapy- [place] MAIN [facility] SERVICES [address] [place], [place], [address] Phone: [phone]   Fax:  [phone]  Name: Unice Vantassel MRN: [id number] Date of Birth: 10-20-1945

## 2019-06-05 ENCOUNTER — Other Ambulatory Visit: Payer: Self-pay

## 2019-06-05 ENCOUNTER — Encounter: Payer: Self-pay | Admitting: Physical Therapy

## 2019-06-05 ENCOUNTER — Ambulatory Visit: Payer: Medicare HMO | Attending: Neurology | Admitting: Physical Therapy

## 2019-06-05 DIAGNOSIS — R2681 Unsteadiness on feet: Secondary | ICD-10-CM | POA: Diagnosis present

## 2019-06-05 DIAGNOSIS — R2689 Other abnormalities of gait and mobility: Secondary | ICD-10-CM

## 2019-06-05 DIAGNOSIS — M6281 Muscle weakness (generalized): Secondary | ICD-10-CM | POA: Diagnosis present

## 2019-06-05 NOTE — Therapy (Addendum)
[place] MAIN [facility] SERVICES [address] [place], [place], [address] Phone: [phone]   Fax:  [phone]  Physical Therapy Treatment  Patient Details  Name: Patricia Hahn MRN: [id number] Date of Birth: 1945-07-05 Referring Provider (PT): Lamonte Sakai   Encounter Date: 06/05/2019  PT End of Session - 06/05/19 0817    Visit Number  22    Number of Visits  33    Date for PT Re-Evaluation  07/03/19    PT Start Time  0800    PT Stop Time  0840    PT Time Calculation (min)  40 min    Equipment Utilized During Treatment  Gait belt    Activity Tolerance  Patient tolerated treatment well;No increased pain    Behavior During Therapy  [facility] for tasks assessed/performed       History reviewed. No pertinent past medical history.  Past Surgical History:  Procedure Laterality Date  . ABDOMINAL HYSTERECTOMY    . APPENDECTOMY    . CERVICAL FUSION    . COLONOSCOPY N/A 10/19/2014   Procedure: COLONOSCOPY;  Surgeon: Hulen Luster, MD;  Location: [facility] ENDOSCOPY;  Service: Gastroenterology;  Laterality: N/A;  . ORIF ANKLE FRACTURE    . SHOULDER ACROMIOPLASTY      There were no vitals filed for this visit.  Subjective Assessment - 06/05/19 0811    Subjective  Patient reported that she is doing well this morning, no falls/stumbles, no pain. Overall thinks therapy is going well.    Pertinent History  Patient is a 74 y.o. female with Hx of R ankle fracture in 2004 after falling off ladder, bilateral plantar fasciitis, shoulder acromioplasty. Is a current smoker of 2 cigarettes/day. Patient received physical therapy in 2017 for balance, which patient claimed resulted in significant improvements. She has c/o frequent LOB while standing, walking, bending over, and lifting, with 2 reported falls in the past 6 months.    Limitations  Lifting;Standing;Walking    How long can you sit comfortably?  N/A; can sit all day.    How long can you stand comfortably?   Able to stand 4 hour shifts at work    How long can you walk comfortably?  Able to work 4 hour shift without stops, uses shopping cart for assistance.    Patient Stated Goals  improve walking and balance    Pain Onset  In the past 7 days       Treatment: Neuromuscular Training: Stool: staggered stance, head turns side/side, up/down x 5 reps each, each foot in front; VCs for proper technique and positioning for each exercise staggered stance, trunk rotation with 2 lb rod, VC to keep UE straight and turn head with trunk  2 x 4 side stepping x 10 feet x 4, fwd tandem walking x 10 x 4 with occasional UE assist Balance beam side stepping and fwd tandem walking x 10 x 4   1/2 foam rock fwd/ bwd x 2 mins with occasional UE assist    Rockerboard Lateral weight shift x10 reps each direction, no UE support, CGA for safety with VCs to tap in each direction, controlling the speed Anterior/Posterior weight shift x10 reps, no UE support, CGA for safety with VCs to utilize ankles to shift weight over toes and back to heels  Hurdle: Forward and backward stepping over hurdle x15 each direction, VCs to take big enough steps and to try to increase speed to work on coordination  Side stepping over hurdle 15x  each direction  Blue Foam: Side stepping x10 on blue balance CGA for safety, VCs for taking a big enough step  Airex pad trunk rotation x2 min, CGA for safety, demonstrated difficulty with keeping arms extended and full rotation with head turn Airex pad, balloon tapping to mirror x2 min, supervision for safety with varying directions and speed of balloon, VCs for utilizing both hands and minimizing UE support  Floor Star exercise: Performed stepping on the star diagram on the floor. Working on weight-shifting forward onto the foot that stepped forward and then weight-shifting back and bringing her feet back together with CGA. Performed 2 reps each foot.       Pt educated throughout session about  proper posture and technique with exercises. Improved exercise technique, movement at target joints, use of target muscles after min to mod verbal, visual, tactile cues. CGA and Min to mod verbal cues used throughout with increased in postural sway and LOB most seen with narrow base of support and while on uneven surfaces. Continues to have balance deficits typical with diagnosis. Patient performs intermediate level exercises without pain behaviors and needs verbal cuing for postural alignment and head positioning Tactile cues and assistance needed to keep lower leg and knee in neutral to avoid compensations with ankle motions.                         PT Education - 06/05/19 0814    Education Details  HEP    Person(s) Educated  Patient    Methods  Explanation;Demonstration;Tactile cues    Comprehension  Verbalized understanding;Returned demonstration;Verbal cues required       PT Short Term Goals - 05/29/19 0802      PT SHORT TERM GOAL #1   Title  Patient will be independent in initial home exercise program to improve strength/mobility for better functional independence with ADLs    Baseline  Provided HEP 03/13/19    Time  4    Period  Weeks    Status  Achieved    Target Date  04/10/19      PT SHORT TERM GOAL #2   Title  Patient will increase normal walking speed 10 meter walk test to >1.51ms as to improve gait speed for better community ambulation and to reduce fall risk.    Baseline  03/13/19 14.13 m/s normal walking speed with frequent LOB.04/19/19=    Time  5    Period  Weeks    Status  Partially Met    Target Date  07/02/18        PT Long Term Goals - 05/29/19 0803      PT LONG TERM GOAL #1   Title  Patient will be independent in finalized home exercise program to improve strength/mobility for better functional independence with ADLs    Baseline  9.94    Time  8    Period  Weeks    Status  On-going    Target Date  07/03/19      PT LONG TERM GOAL  #2   Title  Patient will ascend/descend 4 stairs without rail assist independently without loss of balance to improve ability to get in/out of home.    Baseline  03/13/19 Patient states she uses 1-2 HHA to climb 6 stairs to enter/exit home.    Time  5    Period  Weeks    Target Date  07/03/19      PT LONG TERM GOAL #3  Title  Patient will increase Berg Balance score by > 6 points to demonstrate decreased fall risk during functional activities.    Baseline  03/13/19 46/56 BBT.04/19/19=49/56    Time  5    Period  Weeks    Status  Partially Met    Target Date  07/03/19      PT LONG TERM GOAL #4   Title  Patient will be independent in bending down towards floor and picking up small object (<5 pounds) and then stand back up without loss of balance as to improve ability to pick up and clean up room at home and perform vocational duties.    Baseline  03/13/19 LOB when bending over and lifting.    Time  5    Period  Weeks    Status  Partially Met    Target Date  07/03/19      PT LONG TERM GOAL #5   Title  Pt will increase gross LE strength to 5/5 for improved ability to complete ADLs and IADLs.    Baseline  03/13/19 R>L weakness, ankle DF, hip ext, hip flex, hip abd from 3+ to 4+/5.    Time  5    Period  Weeks    Status  Partially Met    Target Date  07/03/19            Plan - 06/05/19 1594    Clinical Impression Statement  Patient demonstrates wide base of support and decreased dynamic standing balance, though improvements seen in her ability to maintain center of gravity during dynamic standing balance activities. Patient will continue to benefit from skilled therapy in order to improve dynamic standing balance and endurance.   Personal Factors and Comorbidities  Age;Fitness;Profession    Examination-Activity Limitations  Bend;Carry;Lift;Locomotion Level;Reach Overhead;Squat;Stairs;Stand;Transfers    Examination-Participation Restrictions  Yard Work;Shop    Rehab Potential   Excellent    PT Frequency  2x / week    PT Duration  8 weeks    PT Treatment/Interventions  ADLs/Self Care Home Management;Aquatic Therapy;Cryotherapy;Electrical Stimulation;Moist Heat;Ultrasound;Gait training;Stair training;Functional mobility training;Therapeutic activities;Therapeutic exercise;Balance training;Neuromuscular re-education;Patient/family education;Orthotic Fit/Training;Manual techniques;Passive range of motion;Energy conservation    PT Next Visit Plan  progress balance intervention with compliant surfaces and dynamic balance; airex tandem stance    PT Home Exercise Plan  no updates today    Consulted and Agree with Plan of Care  Patient       Patient will benefit from skilled therapeutic intervention in order to improve the following deficits and impairments:  Abnormal gait, Decreased activity tolerance, Decreased balance, Decreased endurance, Decreased mobility, Difficulty walking, Decreased strength, Improper body mechanics, Decreased range of motion  Visit Diagnosis: Other abnormalities of gait and mobility  Muscle weakness (generalized)  Unsteadiness on feet     Problem List Patient Active Problem List   Diagnosis Date Noted  . 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 Plantar fasciitis, bilateral 02/23/2019  . Dermatitis 02/23/2019    Alanson Puls, PT DPT 06/05/2019, 8:18 AM  [place] MAIN [facility] SERVICES [address] [place], [place], [address] Phone: [phone]   Fax:  [phone]  Name: Patricia Hahn MRN: [id number] Date of Birth: [birth year]

## 2019-06-07 ENCOUNTER — Other Ambulatory Visit: Payer: Self-pay

## 2019-06-07 ENCOUNTER — Ambulatory Visit: Payer: Medicare HMO

## 2019-06-07 ENCOUNTER — Encounter: Payer: Self-pay | Admitting: Physical Therapy

## 2019-06-07 DIAGNOSIS — M6281 Muscle weakness (generalized): Secondary | ICD-10-CM

## 2019-06-07 DIAGNOSIS — R2681 Unsteadiness on feet: Secondary | ICD-10-CM

## 2019-06-07 DIAGNOSIS — R2689 Other abnormalities of gait and mobility: Secondary | ICD-10-CM | POA: Diagnosis not present

## 2019-06-07 NOTE — Therapy (Addendum)
[place] MAIN [facility] SERVICES [address] [place], [place], [address] Phone: [phone]   Fax:  [phone]  Physical Therapy Treatment  Patient Details  Name: Patricia Hahn MRN: [id number] Date of Birth: 05/29/1946 Referring Provider (PT): Lamonte Sakai   Encounter Date: 06/07/2019  PT End of Session - 06/07/19 0850    Visit Number  23    Number of Visits  33    Date for PT Re-Evaluation  07/03/19    PT Start Time  0800    PT Stop Time  0845    PT Time Calculation (min)  45 min    Equipment Utilized During Treatment  Gait belt    Activity Tolerance  Patient tolerated treatment well;No increased pain    Behavior During Therapy  [facility] for tasks assessed/performed       History reviewed. No pertinent past medical history.  Past Surgical History:  Procedure Laterality Date  . ABDOMINAL HYSTERECTOMY    . APPENDECTOMY    . CERVICAL FUSION    . COLONOSCOPY N/A 10/19/2014   Procedure: COLONOSCOPY;  Surgeon: Hulen Luster, MD;  Location: [facility] ENDOSCOPY;  Service: Gastroenterology;  Laterality: N/A;  . ORIF ANKLE FRACTURE    . SHOULDER ACROMIOPLASTY      There were no vitals filed for this visit.  Subjective Assessment - 06/07/19 0847    Subjective  Patient reported that she has not been scheduled to work this week.  She feels like her balance is improving.  She has not had any falls at home this week.    Pertinent History  Patient is a 74 y.o. female with Hx of R ankle fracture in 2004 after falling off ladder, bilateral plantar fasciitis, shoulder acromioplasty. Is a current smoker of 2 cigarettes/day. Patient received physical therapy in 2017 for balance, which patient claimed resulted in significant improvements. She has c/o frequent LOB while standing, walking, bending over, and lifting, with 2 reported falls in the past 6 months.    Limitations  Lifting;Standing;Walking    How long can you sit comfortably?  N/A; can sit all day.    How  long can you stand comfortably?  Able to stand 4 hour shifts at work    How long can you walk comfortably?  Able to work 4 hour shift without stops, uses shopping cart for assistance.    Patient Stated Goals  improve walking and balance    Pain Onset  In the past 7 days           Today's Treatment: Octane warm up L2 x 5 min (history obtained during warm up)  Tandem walking forward 2x 5 meters Side steps 2x10 meters 1/2 foam rock fwd/ bwd x 2 mins with occasional UE assist   1/2 foam rock side/side with occasional UE assist x 2 min  Rockerboard: Lateral weight shift x10 reps each direction, no UE support, CGA for safety with VCs to tap in each direction, controlling the speed Anterior/Posterior weight shift x10 reps, no UE support, CGA for safety with VCs to utilize ankles to shift weight over toes and back to heels  Hurdle: Forward and backward stepping over hurdle x15 each direction, VCs to take big enough steps and to try to increase speed to work on coordination  Side stepping over hurdle 15x each direction  Blue Foam: Marches 2x1 min Airex pad trunk rotation with ball  x2 min, CGA for safety Airex pad, diagonal ball hand off to PT high/low  with trunk rotation x2 min, CGA for safety  Square stepping: Pt instructed on which number and which foot to step with; she was most challenged with reverse and diagonal reverse steps- unsteady with weight acceptance onto leading foot.  PT CGA for safety      PT Education - 06/07/19 0850    Education Details  HEP, exercises    Person(s) Educated  Patient    Methods  Explanation;Demonstration       PT Short Term Goals - 05/29/19 0802      PT SHORT TERM GOAL #1   Title  Patient will be independent in initial home exercise program to improve strength/mobility for better functional independence with ADLs    Baseline  Provided HEP 03/13/19    Time  4    Period  Weeks    Status  Achieved    Target Date  04/10/19      PT  SHORT TERM GOAL #2   Title  Patient will increase normal walking speed 10 meter walk test to >1.2ms as to improve gait speed for better community ambulation and to reduce fall risk.    Baseline  03/13/19 14.13 m/s normal walking speed with frequent LOB.04/19/19=    Time  5    Period  Weeks    Status  Partially Met    Target Date  07/02/18        PT Long Term Goals - 05/29/19 0803      PT LONG TERM GOAL #1   Title  Patient will be independent in finalized home exercise program to improve strength/mobility for better functional independence with ADLs    Baseline  9.94    Time  8    Period  Weeks    Status  On-going    Target Date  07/03/19      PT LONG TERM GOAL #2   Title  Patient will ascend/descend 4 stairs without rail assist independently without loss of balance to improve ability to get in/out of home.    Baseline  03/13/19 Patient states she uses 1-2 HHA to climb 6 stairs to enter/exit home.    Time  5    Period  Weeks    Target Date  07/03/19      PT LONG TERM GOAL #3   Title  Patient will increase Berg Balance score by > 6 points to demonstrate decreased fall risk during functional activities.    Baseline  03/13/19 46/56 BBT.04/19/19=49/56    Time  5    Period  Weeks    Status  Partially Met    Target Date  07/03/19      PT LONG TERM GOAL #4   Title  Patient will be independent in bending down towards floor and picking up small object (<5 pounds) and then stand back up without loss of balance as to improve ability to pick up and clean up room at home and perform vocational duties.    Baseline  03/13/19 LOB when bending over and lifting.    Time  5    Period  Weeks    Status  Partially Met    Target Date  07/03/19      PT LONG TERM GOAL #5   Title  Pt will increase gross LE strength to 5/5 for improved ability to complete ADLs and IADLs.    Baseline  03/13/19 R>L weakness, ankle DF, hip ext, hip flex, hip abd from 3+ to 4+/5.    Time  5  Period  Weeks     Status  Partially Met    Target Date  07/03/19            Plan - 06/07/19 8453    Clinical Impression Statement  Pt tol today's session well.  She is most challenged with balance when stepping backwards or diagonally backwards- required CGA for safety during loss of balance. Also had difficulty with maintaining balance during trunk rotation with reaching hight to low on airex pad.  Would benefit from continued practice with multi-task and higher level balance activities.   Personal Factors and Comorbidities  Age;Fitness;Profession    Examination-Activity Limitations  Bend;Carry;Lift;Locomotion Level;Reach Overhead;Squat;Stairs;Stand;Transfers    Examination-Participation Restrictions  Yard Work;Shop    Rehab Potential  Excellent    PT Frequency  2x / week    PT Duration  8 weeks    PT Treatment/Interventions  ADLs/Self Care Home Management;Aquatic Therapy;Cryotherapy;Electrical Stimulation;Moist Heat;Ultrasound;Gait training;Stair training;Functional mobility training;Therapeutic activities;Therapeutic exercise;Balance training;Neuromuscular re-education;Patient/family education;Orthotic Fit/Training;Manual techniques;Passive range of motion;Energy conservation    PT Next Visit Plan  progress balance intervention with compliant surfaces and dynamic balance; airex tandem stance    PT Home Exercise Plan  no updates today    Consulted and Agree with Plan of Care  Patient       Patient will benefit from skilled therapeutic intervention in order to improve the following deficits and impairments:  Abnormal gait, Decreased activity tolerance, Decreased balance, Decreased endurance, Decreased mobility, Difficulty walking, Decreased strength, Improper body mechanics, Decreased range of motion  Visit Diagnosis: Other abnormalities of gait and mobility  Muscle weakness (generalized)  Unsteadiness on feet     Problem List Patient Active Problem List   Diagnosis Date Noted  . Chronic  arthropathy 02/23/2019  . Plantar fasciitis, bilateral 02/23/2019  . Dermatitis 02/23/2019    Pincus Badder 06/07/2019, 10:42 AM  Merdis Delay, PT, DPT Physical Therapist - [facility]  Outpatient Physical Therapy- [place] MAIN [facility] SERVICES [address] [place], [place], [address] Phone: [phone]   Fax:  [phone]  Name: Patricia Hahn MRN: [id number] Date of Birth: 1945/09/23

## 2019-06-12 ENCOUNTER — Ambulatory Visit: Payer: Medicare HMO | Admitting: Physical Therapy

## 2019-06-12 ENCOUNTER — Encounter: Payer: Self-pay | Admitting: Physical Therapy

## 2019-06-12 ENCOUNTER — Other Ambulatory Visit: Payer: Self-pay

## 2019-06-12 DIAGNOSIS — R2689 Other abnormalities of gait and mobility: Secondary | ICD-10-CM | POA: Diagnosis not present

## 2019-06-12 DIAGNOSIS — M6281 Muscle weakness (generalized): Secondary | ICD-10-CM

## 2019-06-12 DIAGNOSIS — R2681 Unsteadiness on feet: Secondary | ICD-10-CM

## 2019-06-12 NOTE — Therapy (Signed)
[place] MAIN [facility] SERVICES [address] [place], [place], [address] Phone: [phone]   Fax:  [phone]  Physical Therapy Treatment  Patient Details  Name: Patricia Hahn MRN: [id number] Date of Birth: 23-Apr-1946 Referring Provider (PT): Lamonte Sakai   Encounter Date: 06/12/2019  PT End of Session - 06/12/19 0847    Visit Number  24    Number of Visits  33    Date for PT Re-Evaluation  07/03/19    Equipment Utilized During Treatment  Gait belt    Activity Tolerance  Patient tolerated treatment well;No increased pain    Behavior During Therapy  [facility] for tasks assessed/performed       History reviewed. No pertinent past medical history.  Past Surgical History:  Procedure Laterality Date  . ABDOMINAL HYSTERECTOMY    . APPENDECTOMY    . CERVICAL FUSION    . COLONOSCOPY N/A 10/19/2014   Procedure: COLONOSCOPY;  Surgeon: Hulen Luster, MD;  Location: [facility] ENDOSCOPY;  Service: Gastroenterology;  Laterality: N/A;  . ORIF ANKLE FRACTURE    . SHOULDER ACROMIOPLASTY      There were no vitals filed for this visit.  Subjective Assessment - 06/12/19 0846    Subjective  Patient reported that she has not been scheduled to work this week.  She feels like her balance is improving.  She has not had any falls at home this week.    Pertinent History  Patient is a 74 y.o. female with Hx of R ankle fracture in 2004 after falling off ladder, bilateral plantar fasciitis, shoulder acromioplasty. Is a current smoker of 2 cigarettes/day. Patient received physical therapy in 2017 for balance, which patient claimed resulted in significant improvements. She has c/o frequent LOB while standing, walking, bending over, and lifting, with 2 reported falls in the past 6 months.    Limitations  Lifting;Standing;Walking    How long can you sit comfortably?  N/A; can sit all day.    How long can you stand comfortably?  Able to stand 4 hour shifts at work    How long  can you walk comfortably?  Able to work 4 hour shift without stops, uses shopping cart for assistance.    Patient Stated Goals  improve walking and balance    Currently in Pain?  No/denies    Pain Score  0-No pain    Pain Onset  In the past 7 days         Neuromuscular Re-education  Rocker board fwd/bwd, side to side x 20 each direction Tandem gait on 2"x4" without UE support x 2 lengths Side stepping on 2"x4" without UE support x 2 lengths Heel/toe raises without UE support 3s hold x 10 each 1/2 foam roll balance with flat side up 30s x 2 reps 1/2 foam roll balance with flat side down 30s x 2 reps 1/2 foam roll tandem balance alternating forward LE 30s x 2 each LE forward Lateral side steps from foam to 6 inch stool left and right x 15 Backwards stepping from foam to 6 inch stool x 15  Star stepping left and right x 10       Pt educated throughout session about proper posture and technique with exercises. Improved exercise technique, movement at target joints, use of target muscles after min to mod verbal, visual, tactile cues. CGA and Min to mod verbal cues used throughout with increased in postural sway and LOB most seen with narrow base of support and while  on uneven surfaces. Continues to have balance deficits typical with diagnosis. Patient performs intermediate level exercises without pain behaviors and needs verbal cuing for postural alignment and head positioning                      PT Education - 06/12/19 0846    Education Details  HEP    Person(s) Educated  Patient    Methods  Explanation    Comprehension  Verbalized understanding;Returned demonstration       PT Short Term Goals - 05/29/19 0802      PT SHORT TERM GOAL #1   Title  Patient will be independent in initial home exercise program to improve strength/mobility for better functional independence with ADLs    Baseline  Provided HEP 03/13/19    Time  4    Period  Weeks    Status  Achieved     Target Date  04/10/19      PT SHORT TERM GOAL #2   Title  Patient will increase normal walking speed 10 meter walk test to >1.34ms as to improve gait speed for better community ambulation and to reduce fall risk.    Baseline  03/13/19 14.13 m/s normal walking speed with frequent LOB.04/19/19=    Time  5    Period  Weeks    Status  Partially Met    Target Date  07/02/18        PT Long Term Goals - 05/29/19 0803      PT LONG TERM GOAL #1   Title  Patient will be independent in finalized home exercise program to improve strength/mobility for better functional independence with ADLs    Baseline  9.94    Time  8    Period  Weeks    Status  On-going    Target Date  07/03/19      PT LONG TERM GOAL #2   Title  Patient will ascend/descend 4 stairs without rail assist independently without loss of balance to improve ability to get in/out of home.    Baseline  03/13/19 Patient states she uses 1-2 HHA to climb 6 stairs to enter/exit home.    Time  5    Period  Weeks    Target Date  07/03/19      PT LONG TERM GOAL #3   Title  Patient will increase Berg Balance score by > 6 points to demonstrate decreased fall risk during functional activities.    Baseline  03/13/19 46/56 BBT.04/19/19=49/56    Time  5    Period  Weeks    Status  Partially Met    Target Date  07/03/19      PT LONG TERM GOAL #4   Title  Patient will be independent in bending down towards floor and picking up small object (<5 pounds) and then stand back up without loss of balance as to improve ability to pick up and clean up room at home and perform vocational duties.    Baseline  03/13/19 LOB when bending over and lifting.    Time  5    Period  Weeks    Status  Partially Met    Target Date  07/03/19      PT LONG TERM GOAL #5   Title  Pt will increase gross LE strength to 5/5 for improved ability to complete ADLs and IADLs.    Baseline  03/13/19 R>L weakness, ankle DF, hip ext, hip flex, hip abd from 3+  to 4+/5.     Time  5    Period  Weeks    Status  Partially Met    Target Date  07/03/19            Plan - 06/12/19 0847    Clinical Impression Statement  Pt presents with unsteadiness on uneven surfaces and fatigues with therapeutic exercises.  Patient tolerated all interventions well this date and will benefit from continued skilled PT interventions to improve strength and balance and decrease risk of falling.   Personal Factors and Comorbidities  Age;Fitness;Profession    Examination-Activity Limitations  Bend;Carry;Lift;Locomotion Level;Reach Overhead;Squat;Stairs;Stand;Transfers    Examination-Participation Restrictions  Yard Work;Shop    Rehab Potential  Excellent    PT Frequency  2x / week    PT Duration  8 weeks    PT Treatment/Interventions  ADLs/Self Care Home Management;Aquatic Therapy;Cryotherapy;Electrical Stimulation;Moist Heat;Ultrasound;Gait training;Stair training;Functional mobility training;Therapeutic activities;Therapeutic exercise;Balance training;Neuromuscular re-education;Patient/family education;Orthotic Fit/Training;Manual techniques;Passive range of motion;Energy conservation    PT Next Visit Plan  progress balance intervention with compliant surfaces and dynamic balance; airex tandem stance    PT Home Exercise Plan  no updates today    Consulted and Agree with Plan of Care  Patient       Patient will benefit from skilled therapeutic intervention in order to improve the following deficits and impairments:  Abnormal gait, Decreased activity tolerance, Decreased balance, Decreased endurance, Decreased mobility, Difficulty walking, Decreased strength, Improper body mechanics, Decreased range of motion  Visit Diagnosis: Other abnormalities of gait and mobility  Muscle weakness (generalized)  Unsteadiness on feet     Problem List Patient Active Problem List   Diagnosis Date Noted  . 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 Plantar fasciitis, bilateral 02/23/2019  .  Dermatitis 02/23/2019    Alanson Puls, PT DPT 06/12/2019, 8:49 AM  [place] MAIN [facility] SERVICES [address] [place], [place], [address] Phone: [phone]   Fax:  [phone]  Name: Coraima Tibbs MRN: [id number] Date of Birth: 1946-02-02

## 2019-06-15 ENCOUNTER — Ambulatory Visit: Payer: Medicare HMO | Admitting: Physical Therapy

## 2019-06-15 ENCOUNTER — Other Ambulatory Visit: Payer: Self-pay

## 2019-06-15 ENCOUNTER — Encounter: Payer: Self-pay | Admitting: Physical Therapy

## 2019-06-15 DIAGNOSIS — M6281 Muscle weakness (generalized): Secondary | ICD-10-CM

## 2019-06-15 DIAGNOSIS — R2689 Other abnormalities of gait and mobility: Secondary | ICD-10-CM | POA: Diagnosis not present

## 2019-06-15 DIAGNOSIS — R2681 Unsteadiness on feet: Secondary | ICD-10-CM

## 2019-06-15 NOTE — Therapy (Signed)
[place] MAIN [facility] SERVICES [address] [place], [place], [address] Phone: [phone]   Fax:  [phone]  Physical Therapy Treatment  Patient Details  Name: Patricia Hahn MRN: [id number] Date of Birth: 1946-02-13 Referring Provider (PT): Lamonte Sakai   Encounter Date: 06/15/2019  PT End of Session - 06/15/19 0846    Visit Number  25    Number of Visits  33    Date for PT Re-Evaluation  07/03/19    PT Start Time  0845    PT Stop Time  0925    PT Time Calculation (min)  40 min    Equipment Utilized During Treatment  Gait belt    Activity Tolerance  Patient tolerated treatment well;No increased pain    Behavior During Therapy  [facility] for tasks assessed/performed       History reviewed. No pertinent past medical history.  Past Surgical History:  Procedure Laterality Date  . ABDOMINAL HYSTERECTOMY    . APPENDECTOMY    . CERVICAL FUSION    . COLONOSCOPY N/A 10/19/2014   Procedure: COLONOSCOPY;  Surgeon: Hulen Luster, MD;  Location: [facility] ENDOSCOPY;  Service: Gastroenterology;  Laterality: N/A;  . ORIF ANKLE FRACTURE    . SHOULDER ACROMIOPLASTY      There were no vitals filed for this visit.  Subjective Assessment - 06/15/19 0846    Subjective  Patient reported that she has not been scheduled to work this week.  She feels like her balance is improving.  She has not had any falls at home this week.    Pertinent History  Patient is a 74 y.o. female with Hx of R ankle fracture in 2004 after falling off ladder, bilateral plantar fasciitis, shoulder acromioplasty. Is a current smoker of 2 cigarettes/day. Patient received physical therapy in 2017 for balance, which patient claimed resulted in significant improvements. She has c/o frequent LOB while standing, walking, bending over, and lifting, with 2 reported falls in the past 6 months.    Limitations  Lifting;Standing;Walking    How long can you sit comfortably?  N/A; can sit all day.    How  long can you stand comfortably?  Able to stand 4 hour shifts at work    How long can you walk comfortably?  Able to work 4 hour shift without stops, uses shopping cart for assistance.    Patient Stated Goals  improve walking and balance    Currently in Pain?  No/denies    Pain Score  0-No pain    Pain Onset  In the past 7 days       Neuromuscular Re-education  Rocker board fwd/bwd, side to side x 20 each direction Heel/toe raises without UE support 3s hold x 10 each 1/2 foam roll balance with flat side up 30s x 2 reps 1/2 foam roll balance with flat side down 30s x 2 reps 1/2 foam roll tandem balance alternating forward LE 30s x 2 each LE forward Foot  from foam to 6 inch stool, trunk rotation  left and right x 15 Fwd / bwd stepping over hurdle x 20        Pt educated throughout session about proper posture and technique with exercises. Improved exercise technique, movement at target joints, use of target muscles after min to mod verbal, visual, tactile cues. CGA and Min to mod verbal cues used throughout with increased in postural sway and LOB most seen with narrow base of support and while on uneven surfaces.  Continues to have balance deficits typical with diagnosis. Patient performs intermediate level exercises without pain behaviors and needs verbal cuing for postural alignment and head positioning                       PT Education - 06/15/19 0846    Education Details  HEP    Person(s) Educated  Patient    Methods  Explanation    Comprehension  Verbalized understanding;Returned demonstration       PT Short Term Goals - 05/29/19 0802      PT SHORT TERM GOAL #1   Title  Patient will be independent in initial home exercise program to improve strength/mobility for better functional independence with ADLs    Baseline  Provided HEP 03/13/19    Time  4    Period  Weeks    Status  Achieved    Target Date  04/10/19      PT SHORT TERM GOAL #2   Title   Patient will increase normal walking speed 10 meter walk test to >1.61ms as to improve gait speed for better community ambulation and to reduce fall risk.    Baseline  03/13/19 14.13 m/s normal walking speed with frequent LOB.04/19/19=    Time  5    Period  Weeks    Status  Partially Met    Target Date  07/02/18        PT Long Term Goals - 05/29/19 0803      PT LONG TERM GOAL #1   Title  Patient will be independent in finalized home exercise program to improve strength/mobility for better functional independence with ADLs    Baseline  9.94    Time  8    Period  Weeks    Status  On-going    Target Date  07/03/19      PT LONG TERM GOAL #2   Title  Patient will ascend/descend 4 stairs without rail assist independently without loss of balance to improve ability to get in/out of home.    Baseline  03/13/19 Patient states she uses 1-2 HHA to climb 6 stairs to enter/exit home.    Time  5    Period  Weeks    Target Date  07/03/19      PT LONG TERM GOAL #3   Title  Patient will increase Berg Balance score by > 6 points to demonstrate decreased fall risk during functional activities.    Baseline  03/13/19 46/56 BBT.04/19/19=49/56    Time  5    Period  Weeks    Status  Partially Met    Target Date  07/03/19      PT LONG TERM GOAL #4   Title  Patient will be independent in bending down towards floor and picking up small object (<5 pounds) and then stand back up without loss of balance as to improve ability to pick up and clean up room at home and perform vocational duties.    Baseline  03/13/19 LOB when bending over and lifting.    Time  5    Period  Weeks    Status  Partially Met    Target Date  07/03/19      PT LONG TERM GOAL #5   Title  Pt will increase gross LE strength to 5/5 for improved ability to complete ADLs and IADLs.    Baseline  03/13/19 R>L weakness, ankle DF, hip ext, hip flex, hip abd from 3+ to 4+/5.  Time  5    Period  Weeks    Status  Partially Met    Target  Date  07/03/19            Plan - 06/15/19 0847    Clinical Impression Statement  Patient instructed in intermediate balance/strengthening exercise. Patient fatigues quickly requiring short rest breaks in between intermediate exercise. Patient instructed in advanced strengthening with increased repetition/resistance. Patient requires CGA for intermediate balance exercise especially with less rail assist. Patient would benefit from additional skilled PT intervention to improve balance/gait safety and reduce fall risk.   Personal Factors and Comorbidities  Age;Fitness;Profession    Examination-Activity Limitations  Bend;Carry;Lift;Locomotion Level;Reach Overhead;Squat;Stairs;Stand;Transfers    Examination-Participation Restrictions  Yard Work;Shop    Rehab Potential  Excellent    PT Frequency  2x / week    PT Duration  8 weeks    PT Treatment/Interventions  ADLs/Self Care Home Management;Aquatic Therapy;Cryotherapy;Electrical Stimulation;Moist Heat;Ultrasound;Gait training;Stair training;Functional mobility training;Therapeutic activities;Therapeutic exercise;Balance training;Neuromuscular re-education;Patient/family education;Orthotic Fit/Training;Manual techniques;Passive range of motion;Energy conservation    PT Next Visit Plan  progress balance intervention with compliant surfaces and dynamic balance; airex tandem stance    PT Home Exercise Plan  no updates today    Consulted and Agree with Plan of Care  Patient       Patient will benefit from skilled therapeutic intervention in order to improve the following deficits and impairments:  Abnormal gait, Decreased activity tolerance, Decreased balance, Decreased endurance, Decreased mobility, Difficulty walking, Decreased strength, Improper body mechanics, Decreased range of motion  Visit Diagnosis: Other abnormalities of gait and mobility  Muscle weakness (generalized)  Unsteadiness on feet     Problem List Patient Active Problem  List   Diagnosis Date Noted  . 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 Plantar fasciitis, bilateral 02/23/2019  . Dermatitis 02/23/2019    Alanson Puls, PT DPT 06/15/2019, 8:48 AM  [place] MAIN [facility] SERVICES [address] [place], [place], [address] Phone: [phone]   Fax:  [phone]  Name: Angelo Caroll MRN: [id number] Date of Birth: [birth year]

## 2019-06-19 ENCOUNTER — Ambulatory Visit: Payer: Medicare HMO | Admitting: Physical Therapy

## 2019-06-19 ENCOUNTER — Other Ambulatory Visit: Payer: Self-pay

## 2019-06-19 ENCOUNTER — Encounter: Payer: Self-pay | Admitting: Physical Therapy

## 2019-06-19 DIAGNOSIS — R2689 Other abnormalities of gait and mobility: Secondary | ICD-10-CM | POA: Diagnosis not present

## 2019-06-19 DIAGNOSIS — M6281 Muscle weakness (generalized): Secondary | ICD-10-CM

## 2019-06-19 DIAGNOSIS — R2681 Unsteadiness on feet: Secondary | ICD-10-CM

## 2019-06-19 NOTE — Therapy (Addendum)
[place] MAIN [facility] SERVICES [address] [place], [place], [address] Phone: [phone]   Fax:  [phone]  Physical Therapy Treatment  Patient Details  Name: Patricia Hahn MRN: [id number] Date of Birth: 09-09-1945 Referring Provider (PT): Lamonte Sakai   Encounter Date: 06/19/2019  PT End of Session - 06/19/19 0812    Visit Number  26    Number of Visits  33    Date for PT Re-Evaluation  07/03/19    PT Start Time  0800    PT Stop Time  0845    PT Time Calculation (min)  45 min    Equipment Utilized During Treatment  Gait belt    Activity Tolerance  Patient tolerated treatment well;No increased pain    Behavior During Therapy  [facility] for tasks assessed/performed       History reviewed. No pertinent past medical history.  Past Surgical History:  Procedure Laterality Date  . ABDOMINAL HYSTERECTOMY    . APPENDECTOMY    . CERVICAL FUSION    . COLONOSCOPY N/A 10/19/2014   Procedure: COLONOSCOPY;  Surgeon: Hulen Luster, MD;  Location: [facility] ENDOSCOPY;  Service: Gastroenterology;  Laterality: N/A;  . ORIF ANKLE FRACTURE    . SHOULDER ACROMIOPLASTY      There were no vitals filed for this visit.  Subjective Assessment - 06/19/19 0811    Subjective  Patient is doing well today, no pain.    Pertinent History  Patient is a 74 y.o. female with Hx of R ankle fracture in 2004 after falling off ladder, bilateral plantar fasciitis, shoulder acromioplasty. Is a current smoker of 2 cigarettes/day. Patient received physical therapy in 2017 for balance, which patient claimed resulted in significant improvements. She has c/o frequent LOB while standing, walking, bending over, and lifting, with 2 reported falls in the past 6 months.    Limitations  Lifting;Standing;Walking    How long can you sit comfortably?  N/A; can sit all day.    How long can you stand comfortably?  Able to stand 4 hour shifts at work    How long can you walk comfortably?  Able to  work 4 hour shift without stops, uses shopping cart for assistance.    Patient Stated Goals  improve walking and balance    Pain Onset  In the past 7 days       Neuromuscular Re-education  Rocker board fwd/bwd, side to side x 20 each direction Tandem gait on blue balance without UE support x 2 lengths Side stepping on blue balance without UE support x 2 lengths Heel/toe raises without UE support 3s hold x 10 each 1/2 foam roll balance with flat side up 30s x 2 reps 1/2 foam roll balance with flat side down 30s x 2 reps 1/2 foam roll tandem balance alternating forward LE 30s x 2 each LE forward Lateral side steps over hurdle left and right x 15 Matrix side stepping left and right and bwd/fwd x 3 at 22.5       Pt educated throughout session about proper posture and technique with exercises. Improved exercise technique, movement at target joints, use of target muscles after min to mod verbal, visual, tactile cues. CGA and Min to mod verbal cues used throughout with increased in postural sway and LOB most seen with narrow base of support and while on uneven surfaces. Continues to have balance deficits typical with diagnosis. Patient performs intermediate level exercises without pain behaviors and needs verbal cuing for  postural alignment and head positioning                        PT Education - 06/19/19 0811    Education Details  HEP    Person(s) Educated  Patient    Methods  Explanation    Comprehension  Verbalized understanding;Returned demonstration;Verbal cues required;Tactile cues required;Need further instruction       PT Short Term Goals - 05/29/19 0802      PT SHORT TERM GOAL #1   Title  Patient will be independent in initial home exercise program to improve strength/mobility for better functional independence with ADLs    Baseline  Provided HEP 03/13/19    Time  4    Period  Weeks    Status  Achieved    Target Date  04/10/19      PT SHORT TERM GOAL  #2   Title  Patient will increase normal walking speed 10 meter walk test to >1.31ms as to improve gait speed for better community ambulation and to reduce fall risk.    Baseline  03/13/19 14.13 m/s normal walking speed with frequent LOB.04/19/19=    Time  5    Period  Weeks    Status  Partially Met    Target Date  07/02/18        PT Long Term Goals - 05/29/19 0803      PT LONG TERM GOAL #1   Title  Patient will be independent in finalized home exercise program to improve strength/mobility for better functional independence with ADLs    Baseline  9.94    Time  8    Period  Weeks    Status  On-going    Target Date  07/03/19      PT LONG TERM GOAL #2   Title  Patient will ascend/descend 4 stairs without rail assist independently without loss of balance to improve ability to get in/out of home.    Baseline  03/13/19 Patient states she uses 1-2 HHA to climb 6 stairs to enter/exit home.    Time  5    Period  Weeks    Target Date  07/03/19      PT LONG TERM GOAL #3   Title  Patient will increase Berg Balance score by > 6 points to demonstrate decreased fall risk during functional activities.    Baseline  03/13/19 46/56 BBT.04/19/19=49/56    Time  5    Period  Weeks    Status  Partially Met    Target Date  07/03/19      PT LONG TERM GOAL #4   Title  Patient will be independent in bending down towards floor and picking up small object (<5 pounds) and then stand back up without loss of balance as to improve ability to pick up and clean up room at home and perform vocational duties.    Baseline  03/13/19 LOB when bending over and lifting.    Time  5    Period  Weeks    Status  Partially Met    Target Date  07/03/19      PT LONG TERM GOAL #5   Title  Pt will increase gross LE strength to 5/5 for improved ability to complete ADLs and IADLs.    Baseline  03/13/19 R>L weakness, ankle DF, hip ext, hip flex, hip abd from 3+ to 4+/5.    Time  5    Period  Weeks  Status  Partially  Met    Target Date  07/03/19            Plan - 06/19/19 4163    Clinical Impression Statement  Patient demonstrates deficits with postural control in tandem and narrow stance on purple foam with EO and EC. Patient demonstrated hesitation with full weight shifting during lunge but minimal cueing resulted in good technique and no LOB.  Patient improved ability to challenge dynamic balance with supervision and without UE assist today.  Patient shows good BLE strength and hip control during DL leg press. Patient will continue to benefit from skilled physical therapy to improve endurance and dynamic balance to reduce fall risk.    Personal Factors and Comorbidities  Age;Fitness;Profession    Examination-Activity Limitations  Bend;Carry;Lift;Locomotion Level;Reach Overhead;Squat;Stairs;Stand;Transfers    Examination-Participation Restrictions  Yard Work;Shop    Rehab Potential  Excellent    PT Frequency  2x / week    PT Duration  8 weeks    PT Treatment/Interventions  ADLs/Self Care Home Management;Aquatic Therapy;Cryotherapy;Electrical Stimulation;Moist Heat;Ultrasound;Gait training;Stair training;Functional mobility training;Therapeutic activities;Therapeutic exercise;Balance training;Neuromuscular re-education;Patient/family education;Orthotic Fit/Training;Manual techniques;Passive range of motion;Energy conservation    PT Next Visit Plan  progress balance intervention with compliant surfaces and dynamic balance; airex tandem stance    PT Home Exercise Plan  no updates today    Consulted and Agree with Plan of Care  Patient       Patient will benefit from skilled therapeutic intervention in order to improve the following deficits and impairments:  Abnormal gait, Decreased activity tolerance, Decreased balance, Decreased endurance, Decreased mobility, Difficulty walking, Decreased strength, Improper body mechanics, Decreased range of motion  Visit Diagnosis: Other abnormalities of gait and  mobility  Muscle weakness (generalized)  Unsteadiness on feet     Problem List Patient Active Problem List   Diagnosis Date Noted  . 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 Plantar fasciitis, bilateral 02/23/2019  . Dermatitis 02/23/2019    Alanson Puls 06/19/2019, 9:42 AM  [place] MAIN [facility] SERVICES [address] [place], [place], [address] Phone: [phone]   Fax:  [phone]  Name: Patricia Hahn MRN: [id number] Date of Birth: [birth year]

## 2019-06-21 ENCOUNTER — Other Ambulatory Visit: Payer: Self-pay

## 2019-06-21 ENCOUNTER — Encounter: Payer: Self-pay | Admitting: Physical Therapy

## 2019-06-21 ENCOUNTER — Ambulatory Visit: Payer: Medicare HMO | Admitting: Physical Therapy

## 2019-06-21 DIAGNOSIS — R2689 Other abnormalities of gait and mobility: Secondary | ICD-10-CM | POA: Diagnosis not present

## 2019-06-21 DIAGNOSIS — M6281 Muscle weakness (generalized): Secondary | ICD-10-CM

## 2019-06-21 DIAGNOSIS — R2681 Unsteadiness on feet: Secondary | ICD-10-CM

## 2019-06-21 NOTE — Therapy (Signed)
[place] MAIN [facility] SERVICES [address] [place], [place], [address] Phone: [phone]   Fax:  [phone]  Physical Therapy Treatment  Patient Details  Name: Patricia Hahn MRN: [id number] Date of Birth: December 11, 1945 Referring Provider (PT): Lamonte Sakai   Encounter Date: 06/21/2019  PT End of Session - 06/21/19 0851    Visit Number  27    Number of Visits  33    Date for PT Re-Evaluation  07/03/19    PT Start Time  0845    PT Stop Time  0925    PT Time Calculation (min)  40 min    Equipment Utilized During Treatment  Gait belt    Activity Tolerance  Patient tolerated treatment well;No increased pain    Behavior During Therapy  [facility] for tasks assessed/performed       History reviewed. No pertinent past medical history.  Past Surgical History:  Procedure Laterality Date  . ABDOMINAL HYSTERECTOMY    . APPENDECTOMY    . CERVICAL FUSION    . COLONOSCOPY N/A 10/19/2014   Procedure: COLONOSCOPY;  Surgeon: Hulen Luster, MD;  Location: [facility] ENDOSCOPY;  Service: Gastroenterology;  Laterality: N/A;  . ORIF ANKLE FRACTURE    . SHOULDER ACROMIOPLASTY      There were no vitals filed for this visit.  Subjective Assessment - 06/21/19 0850    Subjective  Patient is doing well today, no pain.    Pertinent History  Patient is a 74 y.o. female with Hx of R ankle fracture in 2004 after falling off ladder, bilateral plantar fasciitis, shoulder acromioplasty. Is a current smoker of 2 cigarettes/day. Patient received physical therapy in 2017 for balance, which patient claimed resulted in significant improvements. She has c/o frequent LOB while standing, walking, bending over, and lifting, with 2 reported falls in the past 6 months.    Limitations  Lifting;Standing;Walking    How long can you sit comfortably?  N/A; can sit all day.    How long can you stand comfortably?  Able to stand 4 hour shifts at work    How long can you walk comfortably?  Able to  work 4 hour shift without stops, uses shopping cart for assistance.    Patient Stated Goals  improve walking and balance    Currently in Pain?  No/denies    Pain Score  0-No pain    Pain Onset  In the past 7 days       Treatment: Neuromuscular Training: Stool: staggered stance, head turns side/side, up/down x 5 reps each, each foot in front; VCs for proper technique and positioning for each exercise staggered stance, trunk rotation with 2 lb rod, VC to keep UE straight and turn head with trunk  Rockerboard Lateral weight shift x10 reps each direction, no UE support, CGA for safety with VCs to tap in each direction, controlling the speed Anterior/Posterior weight shift x10 reps, no UE support, CGA for safety with VCs to utilize ankles to shift weight over toes and back to heels  Hurdle: Forward and backward stepping over hurdle x15 each direction, VCs to take big enough steps and to try to increase speed to work on coordination  Side stepping over hurdle 15x each direction  Blue Foam: Side stepping x10 on blue balance CGA for safety, VCs for taking a big enough step  Airex pad trunk rotation x2 min, CGA for safety, demonstrated difficulty with keeping arms extended and full rotation with head turn Airex pad, balloon  tapping to mirror x2 min, supervision for safety with varying directions and speed of balloon, VCs for utilizing both hands and minimizing UE support  Floor Star exercise: Performed stepping on the star diagram on the floor. Working on weight-shifting forward onto the foot that stepped forward and then weight-shifting back and bringing her feet back together with CGA. Performed 2 reps each foot.   Leg press 25 lbs heel raises, 20 x 3 50 lbs x 20 x 3    Pt educated throughout session about proper posture and technique with exercises. Improved exercise technique, movement at target joints, use of target muscles after min to mod verbal, visual, tactile cues. CGA and Min to  mod verbal cues used throughout with increased in postural sway and LOB most seen with narrow base of support and while on uneven surfaces. Continues to have balance deficits typical with diagnosis.  Patient performs intermediate level exercises without pain behaviors and needs verbal cuing for postural alignment and head positioning                        PT Education - 06/21/19 0850    Education Details  HEP    Person(s) Educated  Patient    Methods  Explanation;Demonstration;Tactile cues;Verbal cues    Comprehension  Verbalized understanding;Returned demonstration;Verbal cues required;Tactile cues required;Need further instruction       PT Short Term Goals - 05/29/19 0802      PT SHORT TERM GOAL #1   Title  Patient will be independent in initial home exercise program to improve strength/mobility for better functional independence with ADLs    Baseline  Provided HEP 03/13/19    Time  4    Period  Weeks    Status  Achieved    Target Date  04/10/19      PT SHORT TERM GOAL #2   Title  Patient will increase normal walking speed 10 meter walk test to >1.20ms as to improve gait speed for better community ambulation and to reduce fall risk.    Baseline  03/13/19 14.13 m/s normal walking speed with frequent LOB.04/19/19=    Time  5    Period  Weeks    Status  Partially Met    Target Date  07/02/18        PT Long Term Goals - 05/29/19 0803      PT LONG TERM GOAL #1   Title  Patient will be independent in finalized home exercise program to improve strength/mobility for better functional independence with ADLs    Baseline  9.94    Time  8    Period  Weeks    Status  On-going    Target Date  07/03/19      PT LONG TERM GOAL #2   Title  Patient will ascend/descend 4 stairs without rail assist independently without loss of balance to improve ability to get in/out of home.    Baseline  03/13/19 Patient states she uses 1-2 HHA to climb 6 stairs to enter/exit home.     Time  5    Period  Weeks    Target Date  07/03/19      PT LONG TERM GOAL #3   Title  Patient will increase Berg Balance score by > 6 points to demonstrate decreased fall risk during functional activities.    Baseline  03/13/19 46/56 BBT.04/19/19=49/56    Time  5    Period  Weeks    Status  Partially Met    Target Date  07/03/19      PT LONG TERM GOAL #4   Title  Patient will be independent in bending down towards floor and picking up small object (<5 pounds) and then stand back up without loss of balance as to improve ability to pick up and clean up room at home and perform vocational duties.    Baseline  03/13/19 LOB when bending over and lifting.    Time  5    Period  Weeks    Status  Partially Met    Target Date  07/03/19      PT LONG TERM GOAL #5   Title  Pt will increase gross LE strength to 5/5 for improved ability to complete ADLs and IADLs.    Baseline  03/13/19 R>L weakness, ankle DF, hip ext, hip flex, hip abd from 3+ to 4+/5.    Time  5    Period  Weeks    Status  Partially Met    Target Date  07/03/19            Plan - 06/21/19 0851    Clinical Impression Statement  Patient demonstrates deficits with postural control in tandem and narrow stance on purple foam with EO and EC. Patient demonstrated hesitation with full weight shifting during lunge but minimal cueing resulted in good technique and no LOB.  Patient improved ability to challenge dynamic balance with supervision and without UE assist today.  Patient shows good BLE strength and hip control during DL leg press. Patient will continue to benefit from skilled physical therapy to improve endurance and dynamic balance to reduce fall risk.    Personal Factors and Comorbidities  Age;Fitness;Profession    Examination-Activity Limitations  Bend;Carry;Lift;Locomotion Level;Reach Overhead;Squat;Stairs;Stand;Transfers    Examination-Participation Restrictions  Yard Work;Shop    Rehab Potential  Excellent    PT  Frequency  2x / week    PT Duration  8 weeks    PT Treatment/Interventions  ADLs/Self Care Home Management;Aquatic Therapy;Cryotherapy;Electrical Stimulation;Moist Heat;Ultrasound;Gait training;Stair training;Functional mobility training;Therapeutic activities;Therapeutic exercise;Balance training;Neuromuscular re-education;Patient/family education;Orthotic Fit/Training;Manual techniques;Passive range of motion;Energy conservation    PT Next Visit Plan  progress balance intervention with compliant surfaces and dynamic balance; airex tandem stance    PT Home Exercise Plan  no updates today    Consulted and Agree with Plan of Care  Patient       Patient will benefit from skilled therapeutic intervention in order to improve the following deficits and impairments:  Abnormal gait, Decreased activity tolerance, Decreased balance, Decreased endurance, Decreased mobility, Difficulty walking, Decreased strength, Improper body mechanics, Decreased range of motion  Visit Diagnosis: Other abnormalities of gait and mobility  Muscle weakness (generalized)  Unsteadiness on feet     Problem List Patient Active Problem List   Diagnosis Date Noted  . 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 Plantar fasciitis, bilateral 02/23/2019  . Dermatitis 02/23/2019    Alanson Puls, PT DPT 06/21/2019, 8:52 AM  [place] MAIN [facility] SERVICES [address] [place], [place], [address] Phone: [phone]   Fax:  [phone]  Name: Raina Sole MRN: [id number] Date of Birth: [birth year]

## 2019-06-27 ENCOUNTER — Encounter: Payer: Self-pay | Admitting: Physical Therapy

## 2019-06-27 ENCOUNTER — Other Ambulatory Visit: Payer: Self-pay

## 2019-06-27 ENCOUNTER — Ambulatory Visit: Payer: Medicare HMO | Admitting: Physical Therapy

## 2019-06-27 DIAGNOSIS — R2689 Other abnormalities of gait and mobility: Secondary | ICD-10-CM | POA: Diagnosis not present

## 2019-06-27 DIAGNOSIS — R2681 Unsteadiness on feet: Secondary | ICD-10-CM

## 2019-06-27 DIAGNOSIS — M6281 Muscle weakness (generalized): Secondary | ICD-10-CM

## 2019-06-27 NOTE — Therapy (Signed)
[place] MAIN [facility] SERVICES [address] [place], [place], [address] Phone: [phone]   Fax:  [phone]  Physical Therapy Treatment  Patient Details  Name: Patricia Hahn MRN: [id number] Date of Birth: 1945/12/28 Referring Provider (PT): Lamonte Sakai   Encounter Date: 06/27/2019  PT End of Session - 06/27/19 0858    Visit Number  28    Number of Visits  33    Date for PT Re-Evaluation  07/03/19    PT Start Time  0845    PT Stop Time  0925    PT Time Calculation (min)  40 min    Equipment Utilized During Treatment  Gait belt    Activity Tolerance  Patient tolerated treatment well;No increased pain    Behavior During Therapy  [facility] for tasks assessed/performed       History reviewed. No pertinent past medical history.  Past Surgical History:  Procedure Laterality Date  . ABDOMINAL HYSTERECTOMY    . APPENDECTOMY    . CERVICAL FUSION    . COLONOSCOPY N/A 10/19/2014   Procedure: COLONOSCOPY;  Surgeon: Hulen Luster, MD;  Location: [facility] ENDOSCOPY;  Service: Gastroenterology;  Laterality: N/A;  . ORIF ANKLE FRACTURE    . SHOULDER ACROMIOPLASTY      There were no vitals filed for this visit.  Subjective Assessment - 06/27/19 0858    Subjective  Patient is doing well today, no pain.    Pertinent History  Patient is a 74 y.o. female with Hx of R ankle fracture in 2004 after falling off ladder, bilateral plantar fasciitis, shoulder acromioplasty. Is a current smoker of 2 cigarettes/day. Patient received physical therapy in 2017 for balance, which patient claimed resulted in significant improvements. She has c/o frequent LOB while standing, walking, bending over, and lifting, with 2 reported falls in the past 6 months.    Limitations  Lifting;Standing;Walking    How long can you sit comfortably?  N/A; can sit all day.    How long can you stand comfortably?  Able to stand 4 hour shifts at work    How long can you walk comfortably?  Able to  work 4 hour shift without stops, uses shopping cart for assistance.    Patient Stated Goals  improve walking and balance    Currently in Pain?  No/denies    Pain Onset  In the past 7 days       Treatment: Neuromuscular Training: Stool: staggered stance, head turns side/side, up/down x 5 reps each, each foot in front; VCs for proper technique and positioning for each exercise staggered stance, trunk rotation with 2 lb rod, VC to keep UE straight and turn head with trunk  Hurdle: Forward and backward stepping over hurdle x15 each direction, VCs to take big enough steps and to try to increase speed to work on coordination  Side stepping over hurdle 15x each direction  1/2 Foam: Tandem standing flat side up / flat side down x10 on blue balance CGA for safety, VCs for taking a big enough step  1/2 foam flat side up , trunk rotation x2 min, CGA for safety, demonstrated difficulty with keeping arms extended and full rotation with head turn   Floor Star exercise: Performed stepping on the star diagram on the floor. Working on weight-shifting forward onto the foot that stepped forward and then weight-shifting back and bringing her feet back together with CGA. Performed 2 reps each foot.   Matrix: Fwd/bwd gait with 22. 5 lbs  and CGA, cues for posture and stepping strategies, occasional LOBx 3 reps Side stepping left and right and CGA with cues to slow movement x 3 reps    Pt educated throughout session about proper posture and technique with exercises. Improved exercise technique, movement at target joints, use of target muscles after min to mod verbal, visual, tactile cues. CGA and Min to mod verbal cues used throughout with increased in postural sway and LOB most seen with narrow base of support and while on uneven surfaces. Continues to have balance deficits typical with diagnosis. Patient performs intermediate level exercises without pain behaviors and needs verbal cuing for postural  alignment and head positioning Tactile cues and assistance needed to keep lower leg and knee in neutral to avoid compensations with ankle motions.                        PT Education - 06/27/19 0858    Education Details  HEP    Person(s) Educated  Patient    Methods  Explanation;Demonstration;Tactile cues;Verbal cues    Comprehension  Verbalized understanding;Returned demonstration;Tactile cues required;Need further instruction       PT Short Term Goals - 05/29/19 0802      PT SHORT TERM GOAL #1   Title  Patient will be independent in initial home exercise program to improve strength/mobility for better functional independence with ADLs    Baseline  Provided HEP 03/13/19    Time  4    Period  Weeks    Status  Achieved    Target Date  04/10/19      PT SHORT TERM GOAL #2   Title  Patient will increase normal walking speed 10 meter walk test to >1.56ms as to improve gait speed for better community ambulation and to reduce fall risk.    Baseline  03/13/19 14.13 m/s normal walking speed with frequent LOB.04/19/19=    Time  5    Period  Weeks    Status  Partially Met    Target Date  07/02/18        PT Long Term Goals - 05/29/19 0803      PT LONG TERM GOAL #1   Title  Patient will be independent in finalized home exercise program to improve strength/mobility for better functional independence with ADLs    Baseline  9.94    Time  8    Period  Weeks    Status  On-going    Target Date  07/03/19      PT LONG TERM GOAL #2   Title  Patient will ascend/descend 4 stairs without rail assist independently without loss of balance to improve ability to get in/out of home.    Baseline  03/13/19 Patient states she uses 1-2 HHA to climb 6 stairs to enter/exit home.    Time  5    Period  Weeks    Target Date  07/03/19      PT LONG TERM GOAL #3   Title  Patient will increase Berg Balance score by > 6 points to demonstrate decreased fall risk during functional  activities.    Baseline  03/13/19 46/56 BBT.04/19/19=49/56    Time  5    Period  Weeks    Status  Partially Met    Target Date  07/03/19      PT LONG TERM GOAL #4   Title  Patient will be independent in bending down towards floor and picking up small object (<5  pounds) and then stand back up without loss of balance as to improve ability to pick up and clean up room at home and perform vocational duties.    Baseline  03/13/19 LOB when bending over and lifting.    Time  5    Period  Weeks    Status  Partially Met    Target Date  07/03/19      PT LONG TERM GOAL #5   Title  Pt will increase gross LE strength to 5/5 for improved ability to complete ADLs and IADLs.    Baseline  03/13/19 R>L weakness, ankle DF, hip ext, hip flex, hip abd from 3+ to 4+/5.    Time  5    Period  Weeks    Status  Partially Met    Target Date  07/03/19            Plan - 06/27/19 0859    Clinical Impression Statement Pt presents with unsteadiness on uneven surfaces and fatigues with therapeutic exercises.  Patient tolerated all interventions well this date and will benefit from continued skilled PT interventions to improve strength and balance and decrease risk of falling.   Personal Factors and Comorbidities  Age;Fitness;Profession    Examination-Activity Limitations  Bend;Carry;Lift;Locomotion Level;Reach Overhead;Squat;Stairs;Stand;Transfers    Examination-Participation Restrictions  Yard Work;Shop    Rehab Potential  Excellent    PT Frequency  2x / week    PT Duration  8 weeks    PT Treatment/Interventions  ADLs/Self Care Home Management;Aquatic Therapy;Cryotherapy;Electrical Stimulation;Moist Heat;Ultrasound;Gait training;Stair training;Functional mobility training;Therapeutic activities;Therapeutic exercise;Balance training;Neuromuscular re-education;Patient/family education;Orthotic Fit/Training;Manual techniques;Passive range of motion;Energy conservation    PT Next Visit Plan  progress balance  intervention with compliant surfaces and dynamic balance; airex tandem stance    PT Home Exercise Plan  no updates today    Consulted and Agree with Plan of Care  Patient       Patient will benefit from skilled therapeutic intervention in order to improve the following deficits and impairments:  Abnormal gait, Decreased activity tolerance, Decreased balance, Decreased endurance, Decreased mobility, Difficulty walking, Decreased strength, Improper body mechanics, Decreased range of motion  Visit Diagnosis: Other abnormalities of gait and mobility  Muscle weakness (generalized)  Unsteadiness on feet     Problem List Patient Active Problem List   Diagnosis Date Noted  . 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 Plantar fasciitis, bilateral 02/23/2019  . Dermatitis 02/23/2019    Alanson Puls, PT DPT 06/27/2019, 9:00 AM  [place] MAIN [facility] SERVICES [address] [place], [place], [address] Phone: [phone]   Fax:  [phone]  Name: Patricia Hahn MRN: [id number] Date of Birth: [birth year]

## 2019-06-29 ENCOUNTER — Ambulatory Visit: Payer: Medicare HMO | Admitting: Physical Therapy

## 2019-07-03 ENCOUNTER — Encounter: Payer: Self-pay | Admitting: Physical Therapy

## 2019-07-03 ENCOUNTER — Ambulatory Visit: Payer: Medicare HMO | Attending: Neurology | Admitting: Physical Therapy

## 2019-07-03 ENCOUNTER — Other Ambulatory Visit: Payer: Self-pay

## 2019-07-03 DIAGNOSIS — R2689 Other abnormalities of gait and mobility: Secondary | ICD-10-CM | POA: Insufficient documentation

## 2019-07-03 DIAGNOSIS — R2681 Unsteadiness on feet: Secondary | ICD-10-CM | POA: Insufficient documentation

## 2019-07-03 DIAGNOSIS — M6281 Muscle weakness (generalized): Secondary | ICD-10-CM | POA: Diagnosis present

## 2019-07-03 NOTE — Therapy (Signed)
[place] MAIN [facility] SERVICES [address] [place], [place], [address] Phone: [phone]   Fax:  [phone]  Physical Therapy Treatment  Patient Details  Name: Patricia Hahn MRN: [id number] Date of Birth: May 05, 1946 Referring Provider (PT): Lamonte Sakai   Encounter Date: 07/03/2019  PT End of Session - 07/03/19 0900    Visit Number  29    Number of Visits  33    Date for PT Re-Evaluation  07/03/19    PT Start Time  0845    PT Stop Time  0925    PT Time Calculation (min)  40 min    Equipment Utilized During Treatment  Gait belt    Activity Tolerance  Patient tolerated treatment well;No increased pain    Behavior During Therapy  [facility] for tasks assessed/performed       History reviewed. No pertinent past medical history.  Past Surgical History:  Procedure Laterality Date  . ABDOMINAL HYSTERECTOMY    . APPENDECTOMY    . CERVICAL FUSION    . COLONOSCOPY N/A 10/19/2014   Procedure: COLONOSCOPY;  Surgeon: Hulen Luster, MD;  Location: [facility] ENDOSCOPY;  Service: Gastroenterology;  Laterality: N/A;  . ORIF ANKLE FRACTURE    . SHOULDER ACROMIOPLASTY      There were no vitals filed for this visit.  Subjective Assessment - 07/03/19 0859    Subjective  Patient is doing well today, no pain.    Pertinent History  Patient is a 74 y.o. female with Hx of R ankle fracture in 2004 after falling off ladder, bilateral plantar fasciitis, shoulder acromioplasty. Is a current smoker of 2 cigarettes/day. Patient received physical therapy in 2017 for balance, which patient claimed resulted in significant improvements. She has c/o frequent LOB while standing, walking, bending over, and lifting, with 2 reported falls in the past 6 months.    Limitations  Lifting;Standing;Walking    How long can you sit comfortably?  N/A; can sit all day.    How long can you stand comfortably?  Able to stand 4 hour shifts at work    How long can you walk comfortably?  Able to  work 4 hour shift without stops, uses shopping cart for assistance.    Patient Stated Goals  improve walking and balance    Pain Onset  In the past 7 days       Neuromuscular Re-education  Rocker board fwd/bwd, side to side x 20 each direction Tandem gait on 2"x4" without UE support x 2 lengths Side stepping on 2"x4" without UE support x 2 lengths Heel/toe raises without UE support 3s hold x 10 each 1/2 foam roll balance with flat side up 30s x 2 reps 1/2 foam roll balance with flat side down 30s x 2 reps 1/2 foam roll tandem balance alternating forward LE 30s x 2 each LE forward Lateral side steps from foam to 6 inch stool left and right x 15 Backwards stepping from foam to 6 inch stool x 15  Leg press 25 lbs x 20 x 3      Pt educated throughout session about proper posture and technique with exercises. Improved exercise technique, movement at target joints, use of target muscles after min to mod verbal, visual, tactile cues. CGA and Min to mod verbal cues used throughout with increased in postural sway and LOB most seen with narrow base of support and while on uneven surfaces.  PT Education - 07/03/19 0859    Education Details  HEP    Person(s) Educated  Patient    Methods  Explanation    Comprehension  Verbalized understanding       PT Short Term Goals - 05/29/19 0802      PT SHORT TERM GOAL #1   Title  Patient will be independent in initial home exercise program to improve strength/mobility for better functional independence with ADLs    Baseline  Provided HEP 03/13/19    Time  4    Period  Weeks    Status  Achieved    Target Date  04/10/19      PT SHORT TERM GOAL #2   Title  Patient will increase normal walking speed 10 meter walk test to >1.67ms as to improve gait speed for better community ambulation and to reduce fall risk.    Baseline  03/13/19 14.13 m/s normal walking speed with frequent LOB.04/19/19=    Time  5     Period  Weeks    Status  Partially Met    Target Date  07/02/18        PT Long Term Goals - 05/29/19 0803      PT LONG TERM GOAL #1   Title  Patient will be independent in finalized home exercise program to improve strength/mobility for better functional independence with ADLs    Baseline  9.94    Time  8    Period  Weeks    Status  On-going    Target Date  07/03/19      PT LONG TERM GOAL #2   Title  Patient will ascend/descend 4 stairs without rail assist independently without loss of balance to improve ability to get in/out of home.    Baseline  03/13/19 Patient states she uses 1-2 HHA to climb 6 stairs to enter/exit home.    Time  5    Period  Weeks    Target Date  07/03/19      PT LONG TERM GOAL #3   Title  Patient will increase Berg Balance score by > 6 points to demonstrate decreased fall risk during functional activities.    Baseline  03/13/19 46/56 BBT.04/19/19=49/56    Time  5    Period  Weeks    Status  Partially Met    Target Date  07/03/19      PT LONG TERM GOAL #4   Title  Patient will be independent in bending down towards floor and picking up small object (<5 pounds) and then stand back up without loss of balance as to improve ability to pick up and clean up room at home and perform vocational duties.    Baseline  03/13/19 LOB when bending over and lifting.    Time  5    Period  Weeks    Status  Partially Met    Target Date  07/03/19      PT LONG TERM GOAL #5   Title  Pt will increase gross LE strength to 5/5 for improved ability to complete ADLs and IADLs.    Baseline  03/13/19 R>L weakness, ankle DF, hip ext, hip flex, hip abd from 3+ to 4+/5.    Time  5    Period  Weeks    Status  Partially Met    Target Date  07/03/19            Plan - 07/03/19 0901    Clinical Impression Statement  Patient  demonstrates deficits with postural control in tandem and narrow stance on purple foam. Patient demonstrated hesitation with full weight shifting and on  uneven surfaces but minimal cueing resulted in good technique and no LOB.  Patient improved ability to challenge dynamic balance with supervision and with UE assist today.   Patient will continue to benefit from skilled physical therapy to improve endurance and dynamic balance to reduce fall risk    Personal Factors and Comorbidities  Age;Fitness;Profession    Examination-Activity Limitations  Bend;Carry;Lift;Locomotion Level;Reach Overhead;Squat;Stairs;Stand;Transfers    Examination-Participation Restrictions  Yard Work;Shop    Rehab Potential  Excellent    PT Frequency  2x / week    PT Duration  8 weeks    PT Treatment/Interventions  ADLs/Self Care Home Management;Aquatic Therapy;Cryotherapy;Electrical Stimulation;Moist Heat;Ultrasound;Gait training;Stair training;Functional mobility training;Therapeutic activities;Therapeutic exercise;Balance training;Neuromuscular re-education;Patient/family education;Orthotic Fit/Training;Manual techniques;Passive range of motion;Energy conservation    PT Next Visit Plan  progress balance intervention with compliant surfaces and dynamic balance; airex tandem stance    PT Home Exercise Plan  no updates today    Consulted and Agree with Plan of Care  Patient       Patient will benefit from skilled therapeutic intervention in order to improve the following deficits and impairments:  Abnormal gait, Decreased activity tolerance, Decreased balance, Decreased endurance, Decreased mobility, Difficulty walking, Decreased strength, Improper body mechanics, Decreased range of motion  Visit Diagnosis: Other abnormalities of gait and mobility  Muscle weakness (generalized)  Unsteadiness on feet     Problem List Patient Active Problem List   Diagnosis Date Noted  . 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 Plantar fasciitis, bilateral 02/23/2019  . Dermatitis 02/23/2019    Alanson Puls, PT DPT 07/03/2019, 9:02 AM  [place] MAIN [facility] SERVICES [address] [place], [place], [address] Phone: [phone]   Fax:  [phone]  Name: Patricia Hahn MRN: [id number] Date of Birth: 1946/04/06

## 2019-07-05 ENCOUNTER — Ambulatory Visit: Payer: Medicare HMO | Admitting: Physical Therapy

## 2019-07-05 ENCOUNTER — Other Ambulatory Visit: Payer: Self-pay

## 2019-07-05 ENCOUNTER — Encounter: Payer: Self-pay | Admitting: Physical Therapy

## 2019-07-05 DIAGNOSIS — R2689 Other abnormalities of gait and mobility: Secondary | ICD-10-CM

## 2019-07-05 DIAGNOSIS — R2681 Unsteadiness on feet: Secondary | ICD-10-CM

## 2019-07-05 DIAGNOSIS — M6281 Muscle weakness (generalized): Secondary | ICD-10-CM

## 2019-07-05 NOTE — Therapy (Signed)
[place] MAIN [facility] SERVICES [address] [place], [place], [address] Phone: [phone]   Fax:  [phone]  Physical Therapy Treatment  Patient Details  Name: Patricia Hahn MRN: [id number] Date of Birth: 09-23-1945 Referring Provider (PT): Lamonte Sakai   Encounter Date: 07/05/2019  PT End of Session - 07/05/19 0857    Visit Number  30    Number of Visits  33    Date for PT Re-Evaluation  07/03/19    PT Start Time  0845    PT Stop Time  0925    PT Time Calculation (min)  40 min    Equipment Utilized During Treatment  Gait belt    Activity Tolerance  Patient tolerated treatment well;No increased pain    Behavior During Therapy  [facility] for tasks assessed/performed       History reviewed. No pertinent past medical history.  Past Surgical History:  Procedure Laterality Date  . ABDOMINAL HYSTERECTOMY    . APPENDECTOMY    . CERVICAL FUSION    . COLONOSCOPY N/A 10/19/2014   Procedure: COLONOSCOPY;  Surgeon: Hulen Luster, MD;  Location: [facility] ENDOSCOPY;  Service: Gastroenterology;  Laterality: N/A;  . ORIF ANKLE FRACTURE    . SHOULDER ACROMIOPLASTY      There were no vitals filed for this visit.  Subjective Assessment - 07/05/19 0856    Subjective  Patient is doing well today, no pain.    Pertinent History  Patient is a 74 y.o. female with Hx of R ankle fracture in 2004 after falling off ladder, bilateral plantar fasciitis, shoulder acromioplasty. Is a current smoker of 2 cigarettes/day. Patient received physical therapy in 2017 for balance, which patient claimed resulted in significant improvements. She has c/o frequent LOB while standing, walking, bending over, and lifting, with 2 reported falls in the past 6 months.    Limitations  Lifting;Standing;Walking    How long can you sit comfortably?  N/A; can sit all day.    How long can you stand comfortably?  Able to stand 4 hour shifts at work    How long can you walk comfortably?  Able to  work 4 hour shift without stops, uses shopping cart for assistance.    Patient Stated Goals  improve walking and balance    Currently in Pain?  No/denies    Pain Score  0-No pain    Pain Onset  In the past 7 days       Neuromuscular Re-education  Rocker board fwd/bwd, side to side x 20 each direction Tandem gait on balance beam without UE support x 2 lengths Side stepping on balance beam without UE support x 2 lengths Heel/toe raises without UE support 3s hold x 10 each 1/2 foam roll balance with flat side up 30s x 2 reps 1/2 foam roll balance with flat side down 30s x 2 reps 1/2 foam roll tandem balance alternating forward LE 30s x 2 each LE forward Lateral side steps from foam to 6 inch stool left and right x 15 Backwards stepping from foam to 6 inch stool x 15        Pt educated throughout session about proper posture and technique with exercises. Improved exercise technique, movement at target joints, use of target muscles after min to mod verbal, visual, tactile cues. CGA and Min to mod verbal cues used throughout with increased in postural sway and LOB most seen with narrow base of support and while on uneven surfaces.  PT Education - 07/05/19 0856    Education Details  HEP    Person(s) Educated  Patient    Methods  Explanation    Comprehension  Verbalized understanding;Returned demonstration;Tactile cues required       PT Short Term Goals - 05/29/19 0802      PT SHORT TERM GOAL #1   Title  Patient will be independent in initial home exercise program to improve strength/mobility for better functional independence with ADLs    Baseline  Provided HEP 03/13/19    Time  4    Period  Weeks    Status  Achieved    Target Date  04/10/19      PT SHORT TERM GOAL #2   Title  Patient will increase normal walking speed 10 meter walk test to >1.2ms as to improve gait speed for better community ambulation and to reduce fall risk.     Baseline  03/13/19 14.13 m/s normal walking speed with frequent LOB.04/19/19=    Time  5    Period  Weeks    Status  Partially Met    Target Date  07/02/18        PT Long Term Goals - 07/05/19 0857      PT LONG TERM GOAL #1   Title  Patient will be independent in finalized home exercise program to improve strength/mobility for better functional independence with ADLs    Baseline  9.94    Time  8    Period  Weeks    Status  Partially Met    Target Date  07/31/19      PT LONG TERM GOAL #2   Title  Patient will ascend/descend 4 stairs without rail assist independently without loss of balance to improve ability to get in/out of home.    Baseline  03/13/19 Patient states she uses 1-2 HHA to climb 6 stairs to enter/exit home.    Time  5    Period  Weeks    Status  Partially Met    Target Date  07/31/19      PT LONG TERM GOAL #3   Title  Patient will increase Berg Balance score by > 6 points to demonstrate decreased fall risk during functional activities.    Baseline  03/13/19 46/56 BBT.04/19/19=49/56, 07/05/19= 50/56    Time  5    Period  Weeks    Status  Partially Met    Target Date  07/31/19      PT LONG TERM GOAL #4   Title  Patient will be independent in bending down towards floor and picking up small object (<5 pounds) and then stand back up without loss of balance as to improve ability to pick up and clean up room at home and perform vocational duties.    Baseline  03/13/19 LOB when bending over and lifting.07/05/19= no LOB when bending over and lifting    Time  5    Period  Weeks    Status  Partially Met    Target Date  07/31/19      PT LONG TERM GOAL #5   Title  Pt will increase gross LE strength to 5/5 for improved ability to complete ADLs and IADLs.    Baseline  03/13/19 R>L weakness, ankle DF, hip ext, hip flex, hip abd from 3+ to 4+/5., 07/05/19= 3/5 R ankle ,B hips 4/5    Time  5    Period  Weeks    Status  Partially Met  Target Date  07/31/19             Plan - 07/05/19 0857    Clinical Impression Statement  Patient instructed in advanced LE strengthening and intermediate balance exercise. Patient required min VCS to improve weight shift and to increase terminal knee extension for better stance control. Patient would benefit from additional skilled PT intervention to improve strength, balance and gait safety.   Personal Factors and Comorbidities  Age;Fitness;Profession    Examination-Activity Limitations  Bend;Carry;Lift;Locomotion Level;Reach Overhead;Squat;Stairs;Stand;Transfers    Examination-Participation Restrictions  Yard Work;Shop    Rehab Potential  Excellent    PT Frequency  2x / week    PT Duration  8 weeks    PT Treatment/Interventions  ADLs/Self Care Home Management;Aquatic Therapy;Cryotherapy;Electrical Stimulation;Moist Heat;Ultrasound;Gait training;Stair training;Functional mobility training;Therapeutic activities;Therapeutic exercise;Balance training;Neuromuscular re-education;Patient/family education;Orthotic Fit/Training;Manual techniques;Passive range of motion;Energy conservation    PT Next Visit Plan  progress balance intervention with compliant surfaces and dynamic balance; airex tandem stance    PT Home Exercise Plan  no updates today    Consulted and Agree with Plan of Care  Patient       Patient will benefit from skilled therapeutic intervention in order to improve the following deficits and impairments:  Abnormal gait, Decreased activity tolerance, Decreased balance, Decreased endurance, Decreased mobility, Difficulty walking, Decreased strength, Improper body mechanics, Decreased range of motion  Visit Diagnosis: Other abnormalities of gait and mobility  Muscle weakness (generalized)  Unsteadiness on feet     Problem List Patient Active Problem List   Diagnosis Date Noted  . 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 Plantar fasciitis, bilateral 02/23/2019  . Dermatitis 02/23/2019    Alanson Puls, PT DPT 07/05/2019, 9:02 AM  [place] MAIN [facility] SERVICES [address] [place], [place], [address] Phone: [phone]   Fax:  [phone]  Name: Patricia Hahn MRN: [id number] Date of Birth: December 17, 1945

## 2019-07-06 ENCOUNTER — Other Ambulatory Visit: Payer: Self-pay | Admitting: Internal Medicine

## 2019-07-06 DIAGNOSIS — Z1231 Encounter for screening mammogram for malignant neoplasm of breast: Secondary | ICD-10-CM

## 2019-07-10 ENCOUNTER — Ambulatory Visit: Payer: Medicare HMO | Admitting: Physical Therapy

## 2019-07-10 ENCOUNTER — Encounter: Payer: Self-pay | Admitting: Physical Therapy

## 2019-07-10 ENCOUNTER — Other Ambulatory Visit: Payer: Self-pay

## 2019-07-10 DIAGNOSIS — R2681 Unsteadiness on feet: Secondary | ICD-10-CM

## 2019-07-10 DIAGNOSIS — M6281 Muscle weakness (generalized): Secondary | ICD-10-CM

## 2019-07-10 DIAGNOSIS — R2689 Other abnormalities of gait and mobility: Secondary | ICD-10-CM | POA: Diagnosis not present

## 2019-07-10 NOTE — Therapy (Signed)
[place] [facility] MAIN REHAB SERVICES [address] [place], [place], [address] Phone: [phone]   Fax:  [phone]  Physical Therapy Treatment  Patient Details  Name: Patricia Hahn MRN: [id number] Date of Birth: 09/12/1945 Referring Provider (PT): Neelam Khan   Encounter Date: 07/10/2019    History reviewed. No pertinent past medical history.  Past Surgical History:  Procedure Laterality Date  . ABDOMINAL HYSTERECTOMY    . APPENDECTOMY    . CERVICAL FUSION    . COLONOSCOPY N/A 10/19/2014   Procedure: COLONOSCOPY;  Surgeon: Paul Y Oh, MD;  Location: [facility] ENDOSCOPY;  Service: Gastroenterology;  Laterality: N/A;  . ORIF ANKLE FRACTURE    . SHOULDER ACROMIOPLASTY      There were no vitals filed for this visit.  Subjective Assessment - 07/10/19 0920    Subjective  Patient is doing well today, no pain.    Pertinent History  Patient is a 74 y.o. female with Hx of R ankle fracture in 2004 after falling off ladder, bilateral plantar fasciitis, shoulder acromioplasty. Is a current smoker of 2 cigarettes/day. Patient received physical therapy in 2017 for balance, which patient claimed resulted in significant improvements. She has c/o frequent LOB while standing, walking, bending over, and lifting, with 2 reported falls in the past 6 months.    Limitations  Lifting;Standing;Walking    How long can you sit comfortably?  N/A; can sit all day.    How long can you stand comfortably?  Able to stand 4 hour shifts at work    How long can you walk comfortably?  Able to work 4 hour shift without stops, uses shopping cart for assistance.    Patient Stated Goals  improve walking and balance    Currently in Pain?  No/denies    Pain Score  0-No pain    Pain Onset  In the past 7 days       Treatment: Neuromuscular Training:  Rockerboard Lateral weight shift x10 reps each direction, no UE support, CGA for safety with VCs to tap in each direction,  controlling the speed Anterior/Posterior weight shift x10 reps, no UE support, CGA for safety with VCs to utilize ankles to shift weight over toes and back to heels  Hurdle: Forward and backward stepping over hurdle x15 each direction, VCs to take big enough steps and to try to increase speed to work on coordination  Side stepping over hurdle 15x each direction  BOSU: Lunge to BOSU ball and 1/2 foam and hold with trunk rotation x 10 , cues for going slow and to control the speed  Floor Star exercise: Performed stepping on the star diagram on the floor. Working on weight-shifting forward onto the foot that stepped forward and then weight-shifting back and bringing her feet back together with CGA. Performed 2 reps each foot.   Matrix: Fwd/bwd gait with 22. 5 lbs and CGA, cues for posture and stepping strategies, occasional LOB Side stepping left and right and CGA with cues to slow movement    Pt educated throughout session about proper posture and technique with exercises. Improved exercise technique, movement at target joints, use of target muscles after min to mod verbal, visual, tactile cues. CGA and Min to mod verbal cues used throughout with increased in postural sway and LOB most seen with narrow base of support and while on uneven surfaces. Continues to have balance deficits typical with diagnosis. Patient performs intermediate level exercises without pain behaviors and needs verbal cuing for postural   alignment and head positioning                      PT Education - 07/10/19 0921    Education Details  HEP    Person(s) Educated  Patient    Methods  Explanation;Demonstration    Comprehension  Verbalized understanding;Verbal cues required;Tactile cues required       PT Short Term Goals - 05/29/19 0802      PT SHORT TERM GOAL #1   Title  Patient will be independent in initial home exercise program to improve strength/mobility for better functional independence  with ADLs    Baseline  Provided HEP 03/13/19    Time  4    Period  Weeks    Status  Achieved    Target Date  04/10/19      PT SHORT TERM GOAL #2   Title  Patient will increase normal walking speed 10 meter walk test to >1.31ms as to improve gait speed for better community ambulation and to reduce fall risk.    Baseline  03/13/19 14.13 m/s normal walking speed with frequent LOB.04/19/19=    Time  5    Period  Weeks    Status  Partially Met    Target Date  07/02/18        PT Long Term Goals - 07/05/19 0857      PT LONG TERM GOAL #1   Title  Patient will be independent in finalized home exercise program to improve strength/mobility for better functional independence with ADLs    Baseline  9.94    Time  8    Period  Weeks    Status  Partially Met    Target Date  07/31/19      PT LONG TERM GOAL #2   Title  Patient will ascend/descend 4 stairs without rail assist independently without loss of balance to improve ability to get in/out of home.    Baseline  03/13/19 Patient states she uses 1-2 HHA to climb 6 stairs to enter/exit home.    Time  5    Period  Weeks    Status  Partially Met    Target Date  07/31/19      PT LONG TERM GOAL #3   Title  Patient will increase Berg Balance score by > 6 points to demonstrate decreased fall risk during functional activities.    Baseline  03/13/19 46/56 BBT.04/19/19=49/56, 07/05/19= 50/56    Time  5    Period  Weeks    Status  Partially Met    Target Date  07/31/19      PT LONG TERM GOAL #4   Title  Patient will be independent in bending down towards floor and picking up small object (<5 pounds) and then stand back up without loss of balance as to improve ability to pick up and clean up room at home and perform vocational duties.    Baseline  03/13/19 LOB when bending over and lifting.07/05/19= no LOB when bending over and lifting    Time  5    Period  Weeks    Status  Partially Met    Target Date  07/31/19      PT LONG TERM GOAL #5    Title  Pt will increase gross LE strength to 5/5 for improved ability to complete ADLs and IADLs.    Baseline  03/13/19 R>L weakness, ankle DF, hip ext, hip flex, hip abd from 3+ to 4+/5., 07/05/19= 3/5  R ankle ,B hips 4/5    Time  5    Period  Weeks    Status  Partially Met    Target Date  07/31/19            Plan - 07/10/19 0177    Clinical Impression Statement Patient has better control and stability  with increased ankle balance challenges.today. She is able to ambulate backwards with better control . Patient instructed in advanced LE strengthening and intermediate balance exercise. Patient required min VCS to improve weight shift and to increase terminal knee extension for better stance control. Patient would benefit from additional skilled PT intervention to improve strength, balance and gait safety.    Personal Factors and Comorbidities  Age;Fitness;Profession    Examination-Activity Limitations  Bend;Carry;Lift;Locomotion Level;Reach Overhead;Squat;Stairs;Stand;Transfers    Examination-Participation Restrictions  Yard Work;Shop    Rehab Potential  Excellent    PT Frequency  2x / week    PT Duration  8 weeks    PT Treatment/Interventions  ADLs/Self Care Home Management;Aquatic Therapy;Cryotherapy;Electrical Stimulation;Moist Heat;Ultrasound;Gait training;Stair training;Functional mobility training;Therapeutic activities;Therapeutic exercise;Balance training;Neuromuscular re-education;Patient/family education;Orthotic Fit/Training;Manual techniques;Passive range of motion;Energy conservation    PT Next Visit Plan  progress balance intervention with compliant surfaces and dynamic balance; airex tandem stance    PT Home Exercise Plan  no updates today    Consulted and Agree with Plan of Care  Patient       Patient will benefit from skilled therapeutic intervention in order to improve the following deficits and impairments:  Abnormal gait, Decreased activity tolerance, Decreased  balance, Decreased endurance, Decreased mobility, Difficulty walking, Decreased strength, Improper body mechanics, Decreased range of motion  Visit Diagnosis: Other abnormalities of gait and mobility  Muscle weakness (generalized)  Unsteadiness on feet     Problem List Patient Active Problem List   Diagnosis Date Noted  . 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 Plantar fasciitis, bilateral 02/23/2019  . Dermatitis 02/23/2019    Alanson Puls, PT DPT 07/10/2019, 9:26 AM  [place] MAIN [facility] SERVICES [address] [place], [place], [address] Phone: [phone]   Fax:  [phone]  Name: Patricia Hahn MRN: [id number] Date of Birth: [birth year]

## 2019-07-12 ENCOUNTER — Ambulatory Visit: Payer: Medicare HMO | Admitting: Physical Therapy

## 2019-07-12 ENCOUNTER — Encounter: Payer: Self-pay | Admitting: Physical Therapy

## 2019-07-12 ENCOUNTER — Other Ambulatory Visit: Payer: Self-pay

## 2019-07-12 DIAGNOSIS — M6281 Muscle weakness (generalized): Secondary | ICD-10-CM

## 2019-07-12 DIAGNOSIS — R2689 Other abnormalities of gait and mobility: Secondary | ICD-10-CM

## 2019-07-12 DIAGNOSIS — R2681 Unsteadiness on feet: Secondary | ICD-10-CM

## 2019-07-12 NOTE — Addendum Note (Signed)
Addended by: Ezekiel Ina on: 07/12/2019 05:18 PM   Modules accepted: Orders

## 2019-07-12 NOTE — Therapy (Signed)
[place] [facility] SERVICES [address] [place], [place], [address] Phone: [phone]   Fax:  [phone]  Physical Therapy Treatment/ Discharge Summary   Patient Details  Name: Patricia Hahn MRN: [id number] Date of Birth: [birth year] Referring Provider (PT): Lamonte Sakai   Encounter Date: 07/12/2019  PT End of Session - 07/12/19 0855    Visit Number  32    Number of Visits  33    Date for PT Re-Evaluation  07/03/19    PT Start Time  0850    PT Stop Time  0930    PT Time Calculation (min)  40 min    Equipment Utilized During Treatment  Gait belt    Activity Tolerance  Patient tolerated treatment well;No increased pain    Behavior During Therapy  Scnetx for tasks assessed/performed       History reviewed. No pertinent past medical history.  Past Surgical History:  Procedure Laterality Date  . ABDOMINAL HYSTERECTOMY    . APPENDECTOMY    . CERVICAL FUSION    . COLONOSCOPY N/A 10/19/2014   Procedure: COLONOSCOPY;  Surgeon: Hulen Luster, MD;  Location: [facility] ENDOSCOPY;  Service: Gastroenterology;  Laterality: N/A;  . ORIF ANKLE FRACTURE    . SHOULDER ACROMIOPLASTY      There were no vitals filed for this visit.  Subjective Assessment - 07/12/19 0854    Subjective  Patient is doing well today, no pain.    Pertinent History  Patient is a 74 y.o. female with Hx of R ankle fracture in 2004 after falling off ladder, bilateral plantar fasciitis, shoulder acromioplasty. Is a current smoker of 2 cigarettes/day. Patient received physical therapy in 2017 for balance, which patient claimed resulted in significant improvements. She has c/o frequent LOB while standing, walking, bending over, and lifting, with 2 reported falls in the past 6 months.    Limitations  Lifting;Standing;Walking    How long can you sit comfortably?  N/A; can sit all day.    How long can you stand comfortably?  Able to stand 4 hour shifts at work    How long can you walk  comfortably?  Able to work 4 hour shift without stops, uses shopping cart for assistance.    Patient Stated Goals  improve walking and balance    Pain Onset  In the past 7 days       Treatment: Neuromuscular Training:  Stool: staggered stance, head turns side/side, up/down x 5 reps each, each foot in front; VCs for proper technique and positioning for each exercise staggered stance, trunk rotation with 2 lb rod, VC to keep UE straight and turn head with trunk  Tandem gait on 2 x 4 x 4 laps 20 feet, sidestepping on 2 x 4 4 laps 20 feet without UE support  Matrix: Fwd/bwd gait with 22. 5 lbs and CGA, cues for posture and stepping strategies, occasional LOB Side stepping left and right and CGA with cues to slow movement  Leg press  40 lbs BLE x 20 x 3  25 lbs heel raises x 20 x 3   Pt educated throughout session about proper posture and technique with exercises. Improved exercise technique, movement at target joints, use of target muscles after min to mod verbal, visual, tactile cues. CGA and Min to mod verbal cues used throughout with increased in postural sway and LOB most seen with narrow base of support and while on uneven surfaces.  PT Education - 07/12/19 0854    Education Details  HEP    Person(s) Educated  Patient    Methods  Explanation    Comprehension  Verbalized understanding;Returned demonstration;Need further instruction       PT Short Term Goals - 05/29/19 0802      PT SHORT TERM GOAL #1   Title  Patient will be independent in initial home exercise program to improve strength/mobility for better functional independence with ADLs    Baseline  Provided HEP 03/13/19    Time  4    Period  Weeks    Status  Achieved    Target Date  04/10/19      PT SHORT TERM GOAL #2   Title  Patient will increase normal walking speed 10 meter walk test to >1.85ms as to improve gait speed for better community ambulation and to reduce fall  risk.    Baseline  03/13/19 14.13 m/s normal walking speed with frequent LOB.04/19/19=    Time  5    Period  Weeks    Status  Partially Met    Target Date  07/02/18        PT Long Term Goals - 07/05/19 0857      PT LONG TERM GOAL #1   Title  Patient will be independent in finalized home exercise program to improve strength/mobility for better functional independence with ADLs    Baseline  9.94    Time  8    Period  Weeks    Status  Partially Met    Target Date  07/31/19      PT LONG TERM GOAL #2   Title  Patient will ascend/descend 4 stairs without rail assist independently without loss of balance to improve ability to get in/out of home.    Baseline  03/13/19 Patient states she uses 1-2 HHA to climb 6 stairs to enter/exit home.    Time  5    Period  Weeks    Status  Partially Met    Target Date  07/31/19      PT LONG TERM GOAL #3   Title  Patient will increase Berg Balance score by > 6 points to demonstrate decreased fall risk during functional activities.    Baseline  03/13/19 46/56 BBT.04/19/19=49/56, 07/05/19= 50/56    Time  5    Period  Weeks    Status  Partially Met    Target Date  07/31/19      PT LONG TERM GOAL #4   Title  Patient will be independent in bending down towards floor and picking up small object (<5 pounds) and then stand back up without loss of balance as to improve ability to pick up and clean up room at home and perform vocational duties.    Baseline  03/13/19 LOB when bending over and lifting.07/05/19= no LOB when bending over and lifting    Time  5    Period  Weeks    Status  Partially Met    Target Date  07/31/19      PT LONG TERM GOAL #5   Title  Pt will increase gross LE strength to 5/5 for improved ability to complete ADLs and IADLs.    Baseline  03/13/19 R>L weakness, ankle DF, hip ext, hip flex, hip abd from 3+ to 4+/5., 07/05/19= 3/5 R ankle ,B hips 4/5    Time  5    Period  Weeks    Status  Partially Met  Target Date  07/31/19             Plan - 07/12/19 0855    Clinical Impression Statement  Patient has reached goals and will be DC to HEP. Patient required min verbal cues to perform matrix fwd/ bwd/ side stepping with weights for posture and control and required verbal and tactile cues during all dynamic standing balance activities. .  Patient will be DC ffrom skilled therapy and continue with HEP in order to maintain strength.   Personal Factors and Comorbidities  Age;Fitness;Profession    Examination-Activity Limitations  Bend;Carry;Lift;Locomotion Level;Reach Overhead;Squat;Stairs;Stand;Transfers    Examination-Participation Restrictions  Yard Work;Shop    Rehab Potential  Excellent    PT Frequency  2x / week    PT Duration  8 weeks    PT Treatment/Interventions  ADLs/Self Care Home Management;Aquatic Therapy;Cryotherapy;Electrical Stimulation;Moist Heat;Ultrasound;Gait training;Stair training;Functional mobility training;Therapeutic activities;Therapeutic exercise;Balance training;Neuromuscular re-education;Patient/family education;Orthotic Fit/Training;Manual techniques;Passive range of motion;Energy conservation    PT Next Visit Plan  progress balance intervention with compliant surfaces and dynamic balance; airex tandem stance    PT Home Exercise Plan  no updates today    Consulted and Agree with Plan of Care  Patient       Patient will benefit from skilled therapeutic intervention in order to improve the following deficits and impairments:  Abnormal gait, Decreased activity tolerance, Decreased balance, Decreased endurance, Decreased mobility, Difficulty walking, Decreased strength, Improper body mechanics, Decreased range of motion  Visit Diagnosis: Other abnormalities of gait and mobility  Muscle weakness (generalized)  Unsteadiness on feet     Problem List Patient Active Problem List   Diagnosis Date Noted  . 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 Plantar fasciitis, bilateral 02/23/2019  .  Dermatitis 02/23/2019    Alanson Puls, PT DPT 07/12/2019, 8:56 AM  [place] [facility] SERVICES [address] [place], [place], [address] Phone: [phone]   Fax:  [phone]  Name: Kalandra Masters MRN: [id number] Date of Birth: 11-04-1945

## 2019-08-14 ENCOUNTER — Ambulatory Visit
Admission: RE | Admit: 2019-08-14 | Discharge: 2019-08-14 | Disposition: A | Discharge status: Home / Self Care | Payer: Medicare HMO | Source: Ambulatory Visit | Attending: Internal Medicine | Admitting: Internal Medicine

## 2019-08-14 DIAGNOSIS — Z1231 Encounter for screening mammogram for malignant neoplasm of breast: Secondary | ICD-10-CM | POA: Diagnosis present

## 2020-07-08 ENCOUNTER — Other Ambulatory Visit: Payer: Self-pay | Admitting: Internal Medicine

## 2020-07-08 DIAGNOSIS — Z1231 Encounter for screening mammogram for malignant neoplasm of breast: Secondary | ICD-10-CM

## 2020-07-18 ENCOUNTER — Other Ambulatory Visit: Payer: Self-pay | Admitting: Internal Medicine

## 2020-07-18 ENCOUNTER — Other Ambulatory Visit (HOSPITAL_COMMUNITY): Payer: Self-pay | Admitting: Internal Medicine

## 2020-07-18 DIAGNOSIS — M549 Dorsalgia, unspecified: Secondary | ICD-10-CM

## 2020-07-28 ENCOUNTER — Ambulatory Visit
Admission: RE | Admit: 2020-07-28 | Discharge: 2020-07-28 | Disposition: A | Discharge status: Home / Self Care | Payer: Medicare HMO | Source: Ambulatory Visit | Attending: Internal Medicine | Admitting: Internal Medicine

## 2020-07-28 ENCOUNTER — Other Ambulatory Visit: Payer: Self-pay

## 2020-07-28 DIAGNOSIS — M549 Dorsalgia, unspecified: Secondary | ICD-10-CM | POA: Insufficient documentation

## 2020-08-15 ENCOUNTER — Other Ambulatory Visit: Payer: Self-pay

## 2020-08-15 ENCOUNTER — Ambulatory Visit
Admission: RE | Admit: 2020-08-15 | Discharge: 2020-08-15 | Disposition: A | Discharge status: Home / Self Care | Payer: Medicare HMO | Source: Ambulatory Visit | Attending: Internal Medicine | Admitting: Internal Medicine

## 2020-08-15 DIAGNOSIS — Z1231 Encounter for screening mammogram for malignant neoplasm of breast: Secondary | ICD-10-CM | POA: Insufficient documentation

## 2020-11-15 DIAGNOSIS — M5416 Radiculopathy, lumbar region: Secondary | ICD-10-CM | POA: Diagnosis not present

## 2020-11-20 DIAGNOSIS — Z0100 Encounter for examination of eyes and vision without abnormal findings: Secondary | ICD-10-CM | POA: Diagnosis not present

## 2020-11-20 DIAGNOSIS — H35373 Puckering of macula, bilateral: Secondary | ICD-10-CM | POA: Diagnosis not present

## 2020-12-04 DIAGNOSIS — M5416 Radiculopathy, lumbar region: Secondary | ICD-10-CM | POA: Diagnosis not present

## 2020-12-12 IMAGING — MG DIGITAL SCREENING BILATERAL MAMMOGRAM WITH TOMO AND CAD
8 series · 8 of 24 positions shown · non-contrast
Comparison: Previous exam(s).

CLINICAL DATA: Screening.

EXAM:
DIGITAL SCREENING BILATERAL MAMMOGRAM WITH TOMO AND CAD

[L CC synth-2D]
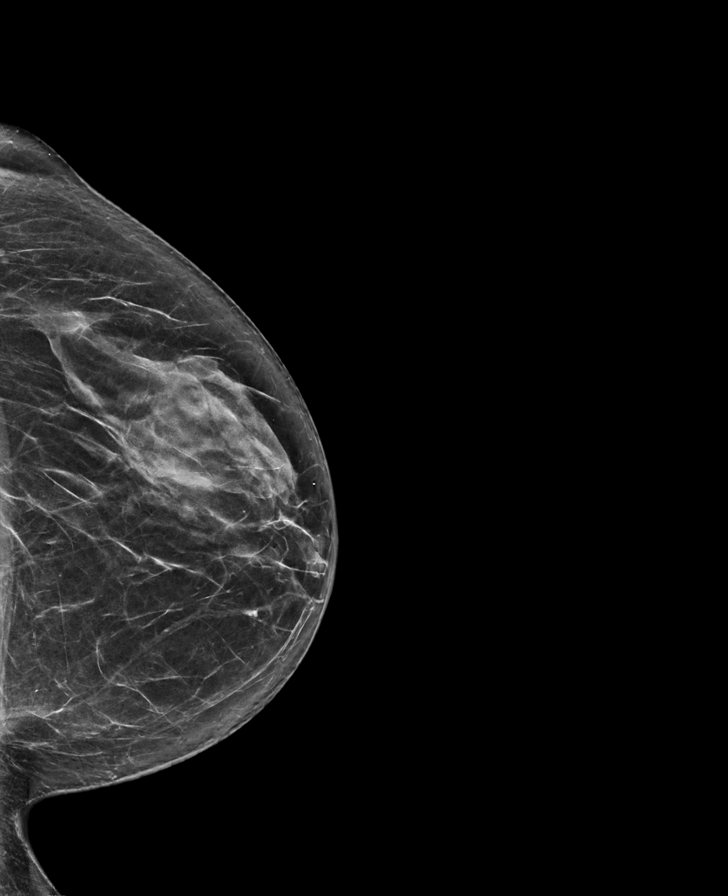

[R MLO synth-2D]
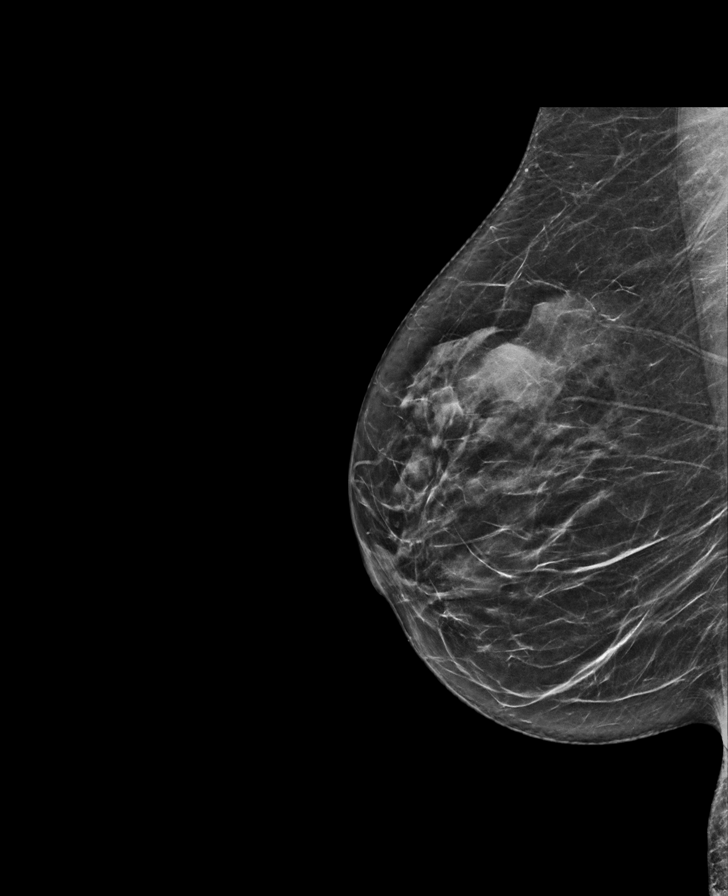

[L MLO synth-2D]
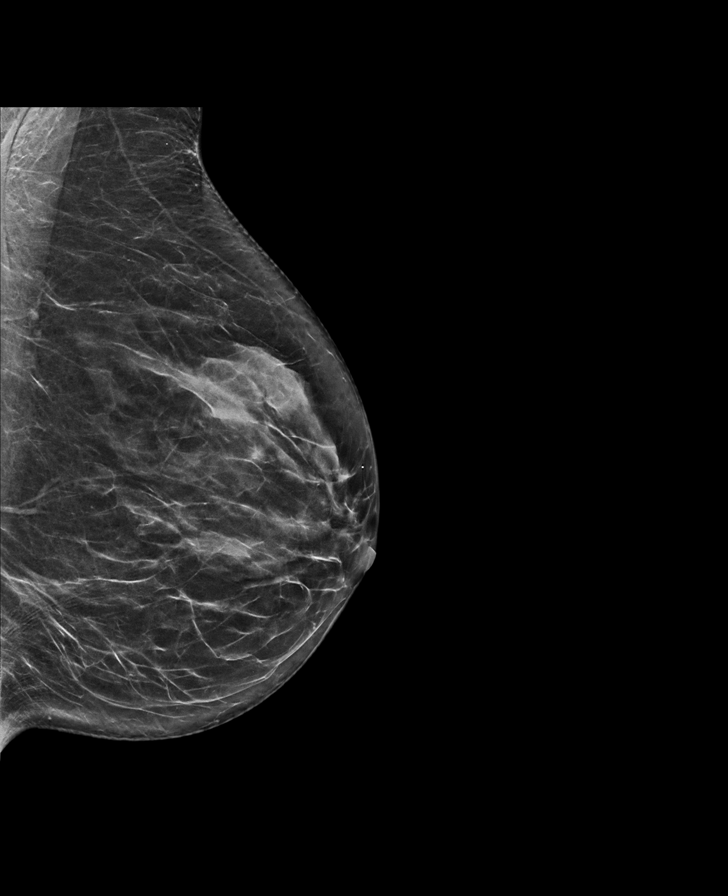

[R CC synth-2D]
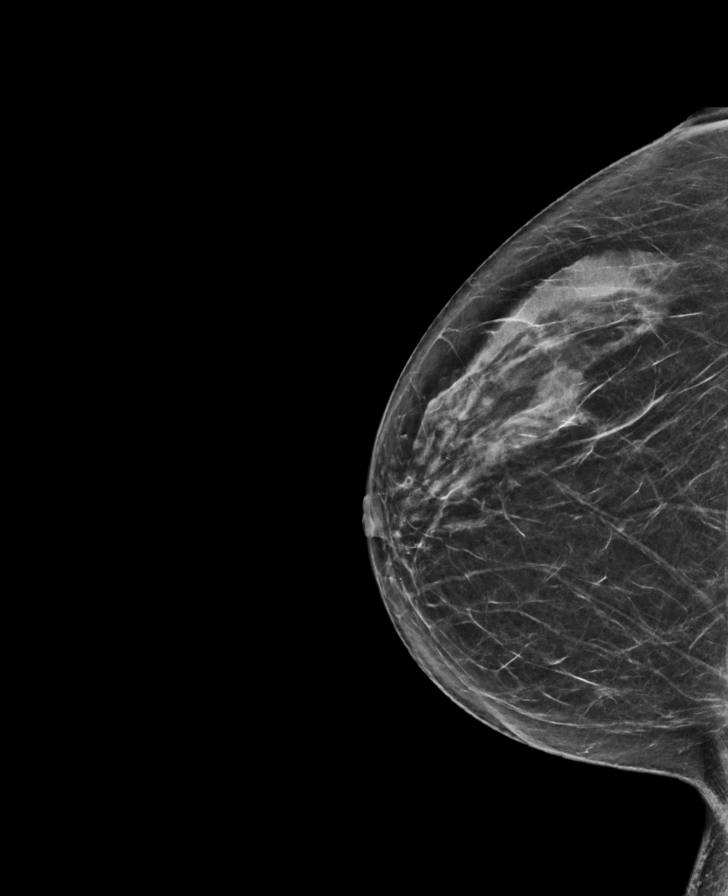

[R CC tomo · tomo slice 31/61.0]
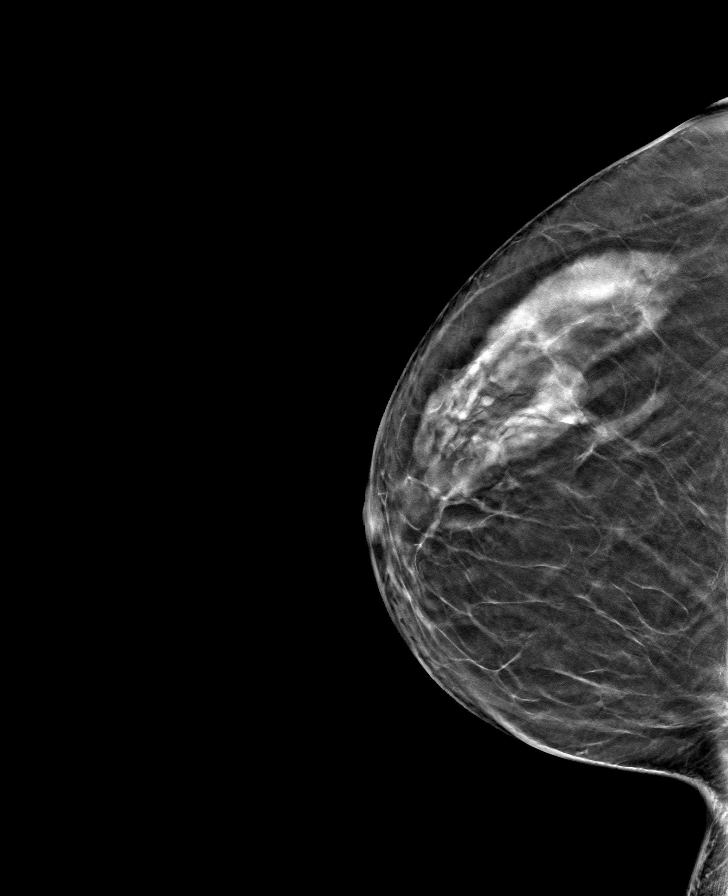

[L CC tomo · tomo slice 34/67.0]
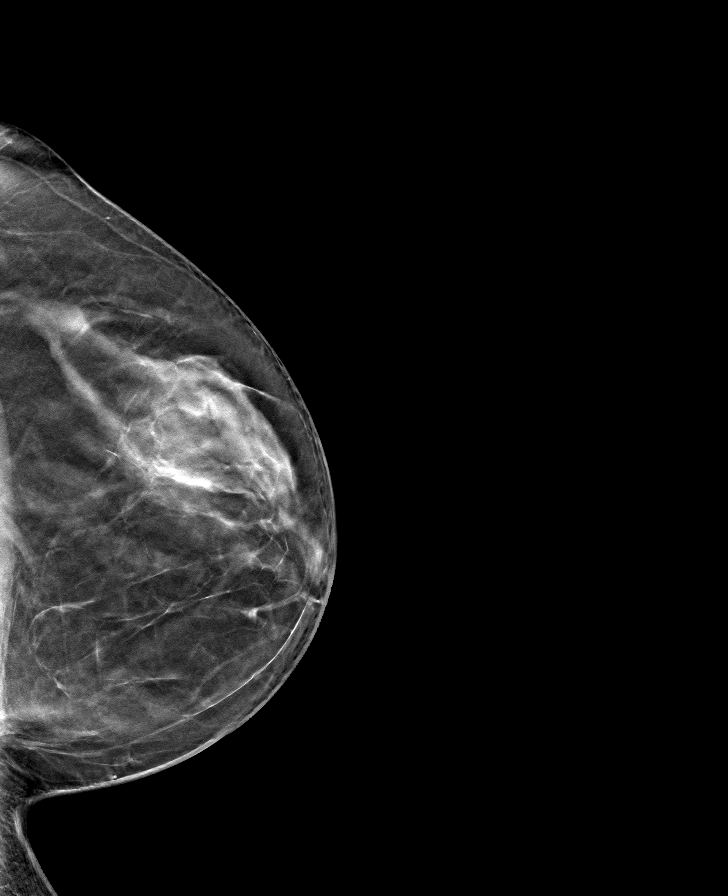

[R MLO tomo · tomo slice 33/65.0]
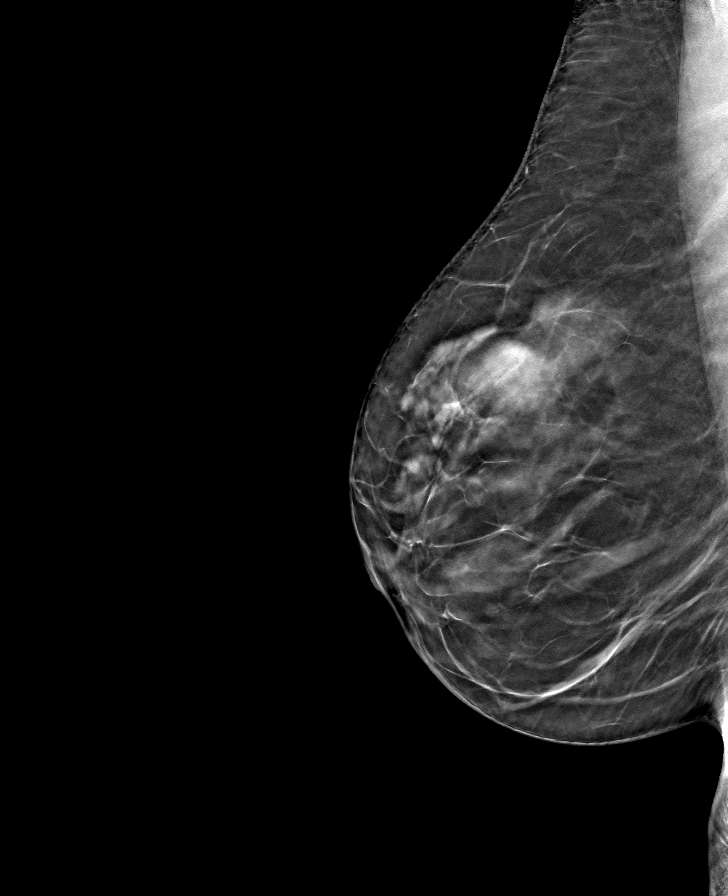

[L MLO tomo · tomo slice 33/66.0]
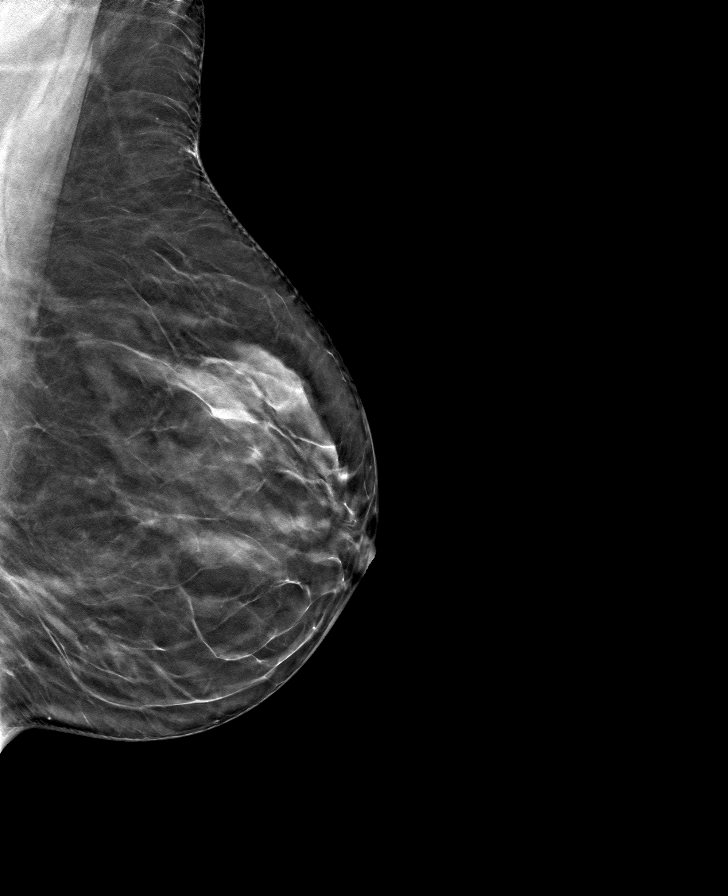

[8 of 24 positions shown; findings below may reference images not displayed]

ACR Breast Density Category c: The breast tissue is heterogeneously
dense, which may obscure small masses.
FINDINGS: There are no findings suspicious for malignancy. Images were
processed with CAD.
IMPRESSION: No mammographic evidence of malignancy. A result letter of this
screening mammogram will be mailed directly to the patient.

RECOMMENDATION:
Screening mammogram in one year. (Code:[id number]-[id number])

BI-RADS CATEGORY  1: Negative.

## 2021-01-03 DIAGNOSIS — M5416 Radiculopathy, lumbar region: Secondary | ICD-10-CM | POA: Diagnosis not present

## 2021-02-06 DIAGNOSIS — E559 Vitamin D deficiency, unspecified: Secondary | ICD-10-CM | POA: Diagnosis not present

## 2021-02-06 DIAGNOSIS — R7303 Prediabetes: Secondary | ICD-10-CM | POA: Diagnosis not present

## 2021-02-06 DIAGNOSIS — I10 Essential (primary) hypertension: Secondary | ICD-10-CM | POA: Diagnosis not present

## 2021-02-06 DIAGNOSIS — E782 Mixed hyperlipidemia: Secondary | ICD-10-CM | POA: Diagnosis not present

## 2021-02-27 DIAGNOSIS — I2510 Atherosclerotic heart disease of native coronary artery without angina pectoris: Secondary | ICD-10-CM | POA: Diagnosis not present

## 2021-02-27 DIAGNOSIS — E782 Mixed hyperlipidemia: Secondary | ICD-10-CM | POA: Diagnosis not present

## 2021-02-27 DIAGNOSIS — J449 Chronic obstructive pulmonary disease, unspecified: Secondary | ICD-10-CM | POA: Diagnosis not present

## 2021-02-27 DIAGNOSIS — I10 Essential (primary) hypertension: Secondary | ICD-10-CM | POA: Diagnosis not present

## 2021-04-04 DIAGNOSIS — M5416 Radiculopathy, lumbar region: Secondary | ICD-10-CM | POA: Diagnosis not present

## 2021-04-08 DIAGNOSIS — Z9181 History of falling: Secondary | ICD-10-CM | POA: Diagnosis not present

## 2021-04-08 DIAGNOSIS — G629 Polyneuropathy, unspecified: Secondary | ICD-10-CM | POA: Diagnosis not present

## 2021-04-08 DIAGNOSIS — R262 Difficulty in walking, not elsewhere classified: Secondary | ICD-10-CM | POA: Diagnosis not present

## 2021-04-08 DIAGNOSIS — R2689 Other abnormalities of gait and mobility: Secondary | ICD-10-CM | POA: Diagnosis not present

## 2021-06-12 DIAGNOSIS — I10 Essential (primary) hypertension: Secondary | ICD-10-CM | POA: Diagnosis not present

## 2021-06-12 DIAGNOSIS — J449 Chronic obstructive pulmonary disease, unspecified: Secondary | ICD-10-CM | POA: Diagnosis not present

## 2021-06-12 DIAGNOSIS — N3941 Urge incontinence: Secondary | ICD-10-CM | POA: Diagnosis not present

## 2021-06-12 DIAGNOSIS — R7303 Prediabetes: Secondary | ICD-10-CM | POA: Diagnosis not present

## 2021-06-12 DIAGNOSIS — E782 Mixed hyperlipidemia: Secondary | ICD-10-CM | POA: Diagnosis not present

## 2021-06-12 DIAGNOSIS — R109 Unspecified abdominal pain: Secondary | ICD-10-CM | POA: Diagnosis not present

## 2021-06-16 DIAGNOSIS — R7303 Prediabetes: Secondary | ICD-10-CM | POA: Diagnosis not present

## 2021-06-16 DIAGNOSIS — E782 Mixed hyperlipidemia: Secondary | ICD-10-CM | POA: Diagnosis not present

## 2021-06-16 DIAGNOSIS — M5417 Radiculopathy, lumbosacral region: Secondary | ICD-10-CM | POA: Diagnosis not present

## 2021-06-16 DIAGNOSIS — I10 Essential (primary) hypertension: Secondary | ICD-10-CM | POA: Diagnosis not present

## 2021-06-16 DIAGNOSIS — E559 Vitamin D deficiency, unspecified: Secondary | ICD-10-CM | POA: Diagnosis not present

## 2021-06-23 DIAGNOSIS — K5730 Diverticulosis of large intestine without perforation or abscess without bleeding: Secondary | ICD-10-CM | POA: Diagnosis not present

## 2021-06-23 DIAGNOSIS — R109 Unspecified abdominal pain: Secondary | ICD-10-CM | POA: Diagnosis not present

## 2021-06-23 DIAGNOSIS — N2889 Other specified disorders of kidney and ureter: Secondary | ICD-10-CM | POA: Diagnosis not present

## 2021-06-23 DIAGNOSIS — K5732 Diverticulitis of large intestine without perforation or abscess without bleeding: Secondary | ICD-10-CM | POA: Diagnosis not present

## 2021-06-23 DIAGNOSIS — R1900 Intra-abdominal and pelvic swelling, mass and lump, unspecified site: Secondary | ICD-10-CM | POA: Diagnosis not present

## 2021-06-30 DIAGNOSIS — I10 Essential (primary) hypertension: Secondary | ICD-10-CM | POA: Diagnosis not present

## 2021-06-30 DIAGNOSIS — I340 Nonrheumatic mitral (valve) insufficiency: Secondary | ICD-10-CM | POA: Diagnosis not present

## 2021-06-30 DIAGNOSIS — I2510 Atherosclerotic heart disease of native coronary artery without angina pectoris: Secondary | ICD-10-CM | POA: Diagnosis not present

## 2021-06-30 DIAGNOSIS — E119 Type 2 diabetes mellitus without complications: Secondary | ICD-10-CM | POA: Diagnosis not present

## 2021-06-30 DIAGNOSIS — J449 Chronic obstructive pulmonary disease, unspecified: Secondary | ICD-10-CM | POA: Diagnosis not present

## 2021-06-30 DIAGNOSIS — E782 Mixed hyperlipidemia: Secondary | ICD-10-CM | POA: Diagnosis not present

## 2021-07-09 DIAGNOSIS — N390 Urinary tract infection, site not specified: Secondary | ICD-10-CM | POA: Diagnosis not present

## 2021-07-09 DIAGNOSIS — R339 Retention of urine, unspecified: Secondary | ICD-10-CM | POA: Diagnosis not present

## 2021-08-05 ENCOUNTER — Other Ambulatory Visit: Payer: Self-pay | Admitting: Internal Medicine

## 2021-08-05 DIAGNOSIS — Z1231 Encounter for screening mammogram for malignant neoplasm of breast: Secondary | ICD-10-CM

## 2021-09-11 DIAGNOSIS — I10 Essential (primary) hypertension: Secondary | ICD-10-CM | POA: Diagnosis not present

## 2021-09-11 DIAGNOSIS — E782 Mixed hyperlipidemia: Secondary | ICD-10-CM | POA: Diagnosis not present

## 2021-09-11 DIAGNOSIS — J449 Chronic obstructive pulmonary disease, unspecified: Secondary | ICD-10-CM | POA: Diagnosis not present

## 2021-09-11 DIAGNOSIS — N393 Stress incontinence (female) (male): Secondary | ICD-10-CM | POA: Diagnosis not present

## 2021-09-11 DIAGNOSIS — I2510 Atherosclerotic heart disease of native coronary artery without angina pectoris: Secondary | ICD-10-CM | POA: Diagnosis not present

## 2021-10-16 ENCOUNTER — Ambulatory Visit
Admission: RE | Admit: 2021-10-16 | Discharge: 2021-10-16 | Disposition: A | Discharge status: Home / Self Care | Payer: Medicare HMO | Source: Ambulatory Visit | Attending: Internal Medicine | Admitting: Internal Medicine

## 2021-10-16 DIAGNOSIS — Z1231 Encounter for screening mammogram for malignant neoplasm of breast: Secondary | ICD-10-CM | POA: Diagnosis not present

## 2021-11-20 DIAGNOSIS — I10 Essential (primary) hypertension: Secondary | ICD-10-CM | POA: Diagnosis not present

## 2021-11-20 DIAGNOSIS — J449 Chronic obstructive pulmonary disease, unspecified: Secondary | ICD-10-CM | POA: Diagnosis not present

## 2021-11-20 DIAGNOSIS — I340 Nonrheumatic mitral (valve) insufficiency: Secondary | ICD-10-CM | POA: Diagnosis not present

## 2021-11-20 DIAGNOSIS — E782 Mixed hyperlipidemia: Secondary | ICD-10-CM | POA: Diagnosis not present

## 2021-11-20 DIAGNOSIS — I2510 Atherosclerotic heart disease of native coronary artery without angina pectoris: Secondary | ICD-10-CM | POA: Diagnosis not present

## 2021-11-21 DIAGNOSIS — M3501 Sicca syndrome with keratoconjunctivitis: Secondary | ICD-10-CM | POA: Diagnosis not present

## 2021-11-27 DIAGNOSIS — J449 Chronic obstructive pulmonary disease, unspecified: Secondary | ICD-10-CM | POA: Diagnosis not present

## 2021-11-27 DIAGNOSIS — E782 Mixed hyperlipidemia: Secondary | ICD-10-CM | POA: Diagnosis not present

## 2021-11-27 DIAGNOSIS — G5603 Carpal tunnel syndrome, bilateral upper limbs: Secondary | ICD-10-CM | POA: Diagnosis not present

## 2021-11-27 DIAGNOSIS — I10 Essential (primary) hypertension: Secondary | ICD-10-CM | POA: Diagnosis not present

## 2021-11-27 DIAGNOSIS — R7303 Prediabetes: Secondary | ICD-10-CM | POA: Diagnosis not present

## 2022-07-29 ENCOUNTER — Other Ambulatory Visit: Payer: Self-pay | Admitting: Family

## 2023-01-06 ENCOUNTER — Other Ambulatory Visit: Payer: Self-pay | Admitting: Family

## 2023-01-14 ENCOUNTER — Encounter: Payer: Self-pay | Admitting: Internal Medicine

## 2023-02-12 ENCOUNTER — Other Ambulatory Visit: Payer: Self-pay | Admitting: Internal Medicine

## 2023-05-08 ENCOUNTER — Other Ambulatory Visit: Payer: Self-pay | Admitting: Internal Medicine
